# Patient Record
Sex: Female | Born: 1995 | Hispanic: No | Marital: Single | Smoking: Never smoker
Health system: Southern US, Community
[De-identification: ages and names within clinical notes are randomized; demographics above are authoritative.]

## PROBLEM LIST (undated history)

## (undated) ENCOUNTER — Inpatient Hospital Stay (HOSPITAL_COMMUNITY): Payer: Self-pay

## (undated) DIAGNOSIS — Z789 Other specified health status: Secondary | ICD-10-CM

## (undated) HISTORY — DX: Other specified health status: Z78.9

## (undated) HISTORY — PX: TONSILLECTOMY AND ADENOIDECTOMY: SHX28

## (undated) HISTORY — PX: WISDOM TOOTH EXTRACTION: SHX21

## (undated) HISTORY — PX: OTHER SURGICAL HISTORY: SHX169

---

## 2018-04-26 LAB — OB RESULTS CONSOLE RPR: RPR: NONREACTIVE

## 2018-04-26 LAB — OB RESULTS CONSOLE ABO/RH: RH Type: POSITIVE

## 2018-04-26 LAB — OB RESULTS CONSOLE GC/CHLAMYDIA
Chlamydia: NEGATIVE
Gonorrhea: NEGATIVE

## 2018-04-26 LAB — OB RESULTS CONSOLE HIV ANTIBODY (ROUTINE TESTING): HIV: NONREACTIVE

## 2018-04-26 LAB — OB RESULTS CONSOLE RUBELLA ANTIBODY, IGM: Rubella: IMMUNE

## 2018-04-26 LAB — OB RESULTS CONSOLE HEPATITIS B SURFACE ANTIGEN: Hepatitis B Surface Ag: NEGATIVE

## 2018-06-21 ENCOUNTER — Encounter: Payer: Self-pay | Admitting: General Practice

## 2018-07-02 ENCOUNTER — Encounter: Payer: Self-pay | Admitting: General Practice

## 2018-07-02 ENCOUNTER — Ambulatory Visit (INDEPENDENT_AMBULATORY_CARE_PROVIDER_SITE_OTHER): Payer: Medicaid Other | Admitting: Obstetrics and Gynecology

## 2018-07-02 ENCOUNTER — Encounter: Payer: Self-pay | Admitting: Obstetrics and Gynecology

## 2018-07-02 ENCOUNTER — Other Ambulatory Visit: Payer: Self-pay

## 2018-07-02 VITALS — BP 129/82 | HR 75 | Temp 98.9°F | Ht 62.0 in | Wt 178.0 lb

## 2018-07-02 DIAGNOSIS — S3992XD Unspecified injury of lower back, subsequent encounter: Secondary | ICD-10-CM

## 2018-07-02 DIAGNOSIS — Z3402 Encounter for supervision of normal first pregnancy, second trimester: Secondary | ICD-10-CM | POA: Diagnosis not present

## 2018-07-02 MED ORDER — CYCLOBENZAPRINE HCL 10 MG PO TABS: 10.0000 mg | ORAL_TABLET | Freq: Three times a day (TID) | ORAL | 1 refills | Status: AC | PRN
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
## 2018-07-02 NOTE — Patient Instructions (Signed)
Second Trimester of Pregnancy The second trimester is from week 13 through week 28, month 4 through 6. This is often the time in pregnancy that you feel your best. Often times, morning sickness has lessened or quit. You may have more energy, and you may get hungry more often. Your unborn baby (fetus) is growing rapidly. At the end of the sixth month, he or she is about 9 inches long and weighs about 1 pounds. You will likely feel the baby move (quickening) between 18 and 20 weeks of pregnancy. Follow these instructions at home:  Avoid all smoking, herbs, and alcohol. Avoid drugs not approved by your doctor.  Do not use any tobacco products, including cigarettes, chewing tobacco, and electronic cigarettes. If you need help quitting, ask your doctor. You may get counseling or other support to help you quit.  Only take medicine as told by your doctor. Some medicines are safe and some are not during pregnancy.  Exercise only as told by your doctor. Stop exercising if you start having cramps.  Eat regular, healthy meals.  Wear a good support bra if your breasts are tender.  Do not use hot tubs, steam rooms, or saunas.  Wear your seat belt when driving.  Avoid raw meat, uncooked cheese, and liter boxes and soil used by cats.  Take your prenatal vitamins.  Take 1500-2000 milligrams of calcium daily starting at the 20th week of pregnancy until you deliver your baby.  Try taking medicine that helps you poop (stool softener) as needed, and if your doctor approves. Eat more fiber by eating fresh fruit, vegetables, and whole grains. Drink enough fluids to keep your pee (urine) clear or pale yellow.  Take warm water baths (sitz baths) to soothe pain or discomfort caused by hemorrhoids. Use hemorrhoid cream if your doctor approves.  If you have puffy, bulging veins (varicose veins), wear support hose. Raise (elevate) your feet for 15 minutes, 3-4 times a day. Limit salt in your diet.  Avoid heavy  lifting, wear low heals, and sit up straight.  Rest with your legs raised if you have leg cramps or low back pain.  Visit your dentist if you have not gone during your pregnancy. Use a soft toothbrush to brush your teeth. Be gentle when you floss.  You can have sex (intercourse) unless your doctor tells you not to.  Go to your doctor visits. Get help if:  You feel dizzy.  You have mild cramps or pressure in your lower belly (abdomen).  You have a nagging pain in your belly area.  You continue to feel sick to your stomach (nauseous), throw up (vomit), or have watery poop (diarrhea).  You have bad smelling fluid coming from your vagina.  You have pain with peeing (urination). Get help right away if:  You have a fever.  You are leaking fluid from your vagina.  You have spotting or bleeding from your vagina.  You have severe belly cramping or pain.  You lose or gain weight rapidly.  You have trouble catching your breath and have chest pain.  You notice sudden or extreme puffiness (swelling) of your face, hands, ankles, feet, or legs.  You have not felt the baby move in over an hour.  You have severe headaches that do not go away with medicine.  You have vision changes. This information is not intended to replace advice given to you by your health care provider. Make sure you discuss any questions you have with your health care   provider. Document Released: 12/03/2009 Document Revised: 02/14/2016 Document Reviewed: 11/09/2012 Elsevier Interactive Patient Education  2017 [employer]vier Inc.  

## 2018-07-04 LAB — AFP TETRA
DIA Mom Value: 1.07
DIA VALUE (EIA): 168.54 pg/mL
DSR (By Age)    1 IN: 1111
DSR (SECOND TRIMESTER) 1 IN: 10000
Gestational Age: 19 WEEKS
MSAFP Mom: 1.24
MSAFP: 54.8 ng/mL
MSHCG Mom: 0.52
MSHCG: 11897 m[IU]/mL
Maternal Age At EDD: 22.7 yr
Osb Risk: 5748
Test Results:: NEGATIVE
UE3 MOM: 1.47
UE3 VALUE: 2.33 ng/mL
Weight: 178 [lb_av]

## 2018-07-05 ENCOUNTER — Telehealth: Payer: Self-pay | Admitting: *Deleted

## 2018-07-05 ENCOUNTER — Telehealth: Payer: Self-pay | Admitting: General Practice

## 2018-07-05 LAB — SICKLE CELL SCREEN: SICKLE CELL SCREEN: NEGATIVE

## 2018-07-05 NOTE — Telephone Encounter (Signed)
-----   Message from Raelyn Mora, [place] sent at 07/04/2018  2:56 PM EDT ----- Please call patient and inform her that her AFP tetra is negative

## 2018-07-05 NOTE — Telephone Encounter (Signed)
Left message for patient to give our office a call in regards to appointment on 07/16/18 at 10:15am with Dr. Adrian Blackwater in [place].

## 2018-07-05 NOTE — Telephone Encounter (Signed)
Patient informed that AFP results are negative. Pt stated understanding.  Clovis Pu, RN

## 2018-07-07 ENCOUNTER — Encounter (HOSPITAL_COMMUNITY): Payer: Self-pay

## 2018-07-12 LAB — SMN1 COPY NUMBER ANALYSIS (SMA CARRIER SCREENING)

## 2018-07-14 ENCOUNTER — Ambulatory Visit (HOSPITAL_COMMUNITY)
Admission: RE | Admit: 2018-07-14 | Discharge: 2018-07-14 | Disposition: A | Discharge status: Home / Self Care | Payer: Medicaid Other | Source: Ambulatory Visit | Attending: Obstetrics and Gynecology | Admitting: Obstetrics and Gynecology

## 2018-07-14 ENCOUNTER — Other Ambulatory Visit: Payer: Self-pay | Admitting: Obstetrics and Gynecology

## 2018-07-14 DIAGNOSIS — O99212 Obesity complicating pregnancy, second trimester: Secondary | ICD-10-CM | POA: Diagnosis not present

## 2018-07-14 DIAGNOSIS — Z3A21 21 weeks gestation of pregnancy: Secondary | ICD-10-CM | POA: Diagnosis not present

## 2018-07-14 DIAGNOSIS — Z3402 Encounter for supervision of normal first pregnancy, second trimester: Secondary | ICD-10-CM

## 2018-07-14 DIAGNOSIS — Z3686 Encounter for antenatal screening for cervical length: Secondary | ICD-10-CM | POA: Diagnosis not present

## 2018-07-14 DIAGNOSIS — Z3689 Encounter for other specified antenatal screening: Secondary | ICD-10-CM | POA: Insufficient documentation

## 2018-07-14 DIAGNOSIS — E669 Obesity, unspecified: Secondary | ICD-10-CM | POA: Diagnosis not present

## 2018-07-14 DIAGNOSIS — Z363 Encounter for antenatal screening for malformations: Secondary | ICD-10-CM | POA: Diagnosis not present

## 2018-07-15 ENCOUNTER — Encounter (HOSPITAL_COMMUNITY): Payer: Self-pay | Admitting: Emergency Medicine

## 2018-07-15 ENCOUNTER — Other Ambulatory Visit: Payer: Self-pay | Admitting: Obstetrics and Gynecology

## 2018-07-15 ENCOUNTER — Telehealth: Payer: Self-pay

## 2018-07-15 ENCOUNTER — Other Ambulatory Visit: Payer: Self-pay

## 2018-07-15 ENCOUNTER — Other Ambulatory Visit (HOSPITAL_COMMUNITY): Payer: Self-pay | Admitting: *Deleted

## 2018-07-15 DIAGNOSIS — Z331 Pregnant state, incidental: Secondary | ICD-10-CM | POA: Insufficient documentation

## 2018-07-15 DIAGNOSIS — Z3402 Encounter for supervision of normal first pregnancy, second trimester: Secondary | ICD-10-CM

## 2018-07-15 DIAGNOSIS — R21 Rash and other nonspecific skin eruption: Secondary | ICD-10-CM | POA: Diagnosis present

## 2018-07-15 DIAGNOSIS — H01119 Allergic dermatitis of unspecified eye, unspecified eyelid: Secondary | ICD-10-CM | POA: Insufficient documentation

## 2018-07-15 DIAGNOSIS — Z362 Encounter for other antenatal screening follow-up: Secondary | ICD-10-CM

## 2018-07-15 NOTE — ED Notes (Addendum)
Pt from home with c/o swelling and pain to bilateral eyes. Pt states that this began over 1 month ago. Pt states she had a panic attack yesterday at work and that's when symptoms became increasingly worse. No airway obstruction, SOB, or signs hives or skin reaction in any other parts of her body. Pt is [redacted] weeks pregnant. Pt took benadryl earlier today with no change in symptoms. Pt was seen at [facility] today and was told this was eczema. Pt states she "doesn't believe that" and wants a second opinion.

## 2018-07-15 NOTE — Telephone Encounter (Signed)
Patient called in reporting she used Eucerin, Cetaphil and Coconut Oil on the skin around her eyes yesterday, Wednesday, 07/14/18 due to dryness. Patient reports that she began noticing the area around her eyes becoming swollen and very red. She reports she put a cool washcloth over her eyes in hopes of having relief from the swelling and redness. She also reports she did have an anxiety attack yesterday - feels like this may have increased the swelling and redness. Patient reports that she feels the swelling has gotten worse this morning and is concerned.  Reviewed with provider - advised patient she can go to the nearest Urgent Care NOT [facility] for evaluation and treatment of her eyes. Patient stated she understood and had no further questions.

## 2018-07-16 ENCOUNTER — Encounter: Payer: Self-pay | Admitting: Family Medicine

## 2018-07-16 ENCOUNTER — Ambulatory Visit: Payer: Medicaid Other | Admitting: Family Medicine

## 2018-07-16 ENCOUNTER — Emergency Department (HOSPITAL_COMMUNITY)
Admission: EM | Admit: 2018-07-16 | Discharge: 2018-07-16 | Disposition: A | Discharge status: Home / Self Care | Payer: Medicaid Other | Attending: Emergency Medicine | Admitting: Emergency Medicine

## 2018-07-16 ENCOUNTER — Ambulatory Visit (INDEPENDENT_AMBULATORY_CARE_PROVIDER_SITE_OTHER): Payer: Medicaid Other | Admitting: Family Medicine

## 2018-07-16 VITALS — BP 133/86 | HR 104 | Wt 182.0 lb

## 2018-07-16 DIAGNOSIS — Z3402 Encounter for supervision of normal first pregnancy, second trimester: Secondary | ICD-10-CM

## 2018-07-16 DIAGNOSIS — H01119 Allergic dermatitis of unspecified eye, unspecified eyelid: Secondary | ICD-10-CM

## 2018-07-16 DIAGNOSIS — S3992XD Unspecified injury of lower back, subsequent encounter: Secondary | ICD-10-CM

## 2018-07-16 DIAGNOSIS — M545 Low back pain, unspecified: Secondary | ICD-10-CM

## 2018-07-16 DIAGNOSIS — M9903 Segmental and somatic dysfunction of lumbar region: Secondary | ICD-10-CM

## 2018-07-16 MED ORDER — HYDROCORTISONE 1 % EX CREA: TOPICAL_CREAM | CUTANEOUS | 0 refills | Status: DC

## 2018-07-16 NOTE — ED Provider Notes (Signed)
[facility]-EMERGENCY DEPT Provider Note   CSN: [id number] Arrival date & time: 07/15/18  2203     History   Chief Complaint Chief Complaint  Patient presents with  . Rash    HPI Jessica Ayala is a 22 y.o. female.  Patient presents to the emergency department with a chief complaint of eyelid rash.  She reports having the symptoms for the past several days.  She has tried using Benadryl with little relief.  She is about [redacted] weeks pregnant.  She has tried several different lotions and creams as well as cucumbers, but has not found any relief of her symptoms.  She denies any fevers chills.  Denies any eye pain or pain with eye movement.  Denies vision changes.  The history is provided by the patient. No language interpreter was used.    Past Medical History:  Diagnosis Date  . Medical history non-contributory     Patient Active Problem List   Diagnosis Date Noted  . Encounter for supervision of normal first pregnancy in second trimester 07/02/2018    Past Surgical History:  Procedure Laterality Date  . TONSILLECTOMY AND ADENOIDECTOMY    . WISDOM TOOTH EXTRACTION       OB History    Gravida  1   Para      Term      Preterm      AB      Living        SAB      TAB      Ectopic      Multiple      Live Births               Home Medications    Prior to Admission medications   Medication Sig Start Date End Date Taking? Authorizing Provider  cyclobenzaprine (FLEXERIL) 10 MG tablet Take 1 tablet (10 mg total) by mouth every 8 (eight) hours as needed for muscle spasms. 07/02/18   Raelyn Mora, CNM  hydrocortisone cream 1 % Apply to affected area 2 times daily 07/16/18   Roxy Horseman, PA-C  ondansetron (ZOFRAN-ODT) 8 MG disintegrating tablet Take 8 mg by mouth every 8 (eight) hours as needed for nausea or vomiting (PRN).    [provider]    Family History No family history on file.  Social History Social History     Tobacco Use  . Smoking status: Never Smoker  . Smokeless tobacco: Never Used  Substance Use Topics  . Alcohol use: Never    Frequency: Never  . Drug use: Never     Allergies   Patient has no known allergies.   Review of Systems Review of Systems  All other systems reviewed and are negative.    Physical Exam Updated Vital Signs BP 123/84 (BP Location: Left Arm)   Pulse 87   Temp 97.9 F (36.6 C) (Oral)   Resp 16   LMP 02/14/2018 (Approximate)   SpO2 100%   Physical Exam  Constitutional: She is oriented to person, place, and time. She appears well-developed and well-nourished.  HENT:  Head: Normocephalic and atraumatic.  Mild eyelid dermatitis, no evidence of cellulitis  Eyes: Conjunctivae and EOM are normal.  No conjunctival erythema or discharge  Neck: Normal range of motion.  Cardiovascular: Normal rate.  Pulmonary/Chest: Effort normal.  Abdominal: She exhibits no distension.  Musculoskeletal: Normal range of motion.  Neurological: She is alert and oriented to person, place, and time.  Skin: Skin is dry.  Psychiatric:  She has a normal mood and affect. Her behavior is normal. Judgment and thought content normal.  Nursing note and vitals reviewed.    ED Treatments / Results  Labs (all labs ordered are listed, but only abnormal results are displayed) Labs Reviewed - No data to display  EKG None  Procedures Procedures (including critical care time)  Medications Ordered in ED Medications - No data to display   Initial Impression / Assessment and Plan / ED Course  I have reviewed the triage vital signs and the nursing notes.  Pertinent labs & imaging results that were available during my care of the patient were reviewed by me and considered in my medical decision making (see chart for details).     Patient with eyelid dermatitis.  Will treat with 1% hydrocortisone.  Least potent dose given pregnancy.  Final Clinical Impressions(s) / ED  Diagnoses   Final diagnoses:  Eyelid dermatitis, allergic/contact    ED Discharge Orders         Ordered    hydrocortisone cream 1 %     07/16/18 0304           Roxy Horseman, PA-C 07/16/18 1610    Gilda Crease, MD 07/16/18 [phone]
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
## 2018-07-29 ENCOUNTER — Encounter: Payer: Medicaid Other | Admitting: Obstetrics and Gynecology

## 2018-07-29 ENCOUNTER — Ambulatory Visit (INDEPENDENT_AMBULATORY_CARE_PROVIDER_SITE_OTHER): Payer: Medicaid Other | Admitting: Family Medicine

## 2018-07-29 ENCOUNTER — Other Ambulatory Visit (HOSPITAL_COMMUNITY)
Admission: RE | Admit: 2018-07-29 | Discharge: 2018-07-29 | Disposition: A | Discharge status: Home / Self Care | Payer: Medicaid Other | Source: Ambulatory Visit | Attending: Family Medicine | Admitting: Family Medicine

## 2018-07-29 VITALS — BP 123/57 | HR 84 | Wt 185.0 lb

## 2018-07-29 DIAGNOSIS — O26899 Other specified pregnancy related conditions, unspecified trimester: Secondary | ICD-10-CM | POA: Insufficient documentation

## 2018-07-29 DIAGNOSIS — M545 Low back pain, unspecified: Secondary | ICD-10-CM

## 2018-07-29 DIAGNOSIS — N898 Other specified noninflammatory disorders of vagina: Secondary | ICD-10-CM | POA: Insufficient documentation

## 2018-07-29 DIAGNOSIS — O26892 Other specified pregnancy related conditions, second trimester: Secondary | ICD-10-CM

## 2018-07-29 DIAGNOSIS — Z3402 Encounter for supervision of normal first pregnancy, second trimester: Secondary | ICD-10-CM

## 2018-07-29 DIAGNOSIS — S3992XD Unspecified injury of lower back, subsequent encounter: Secondary | ICD-10-CM

## 2018-07-29 DIAGNOSIS — M9903 Segmental and somatic dysfunction of lumbar region: Secondary | ICD-10-CM

## 2018-07-29 NOTE — Progress Notes (Signed)
   PRENATAL VISIT NOTE  Subjective:  Jessica Ayala is a 22 y.o. G1P0 at [redacted]w[redacted]d being seen today for ongoing prenatal care.  She is currently monitored for the following issues for this low-risk pregnancy and has Encounter for supervision of normal first pregnancy in second trimester on their problem list.  Patient reports had moderate amount of watery vaginal discharge that wet her pants. No continued leaking of fluid after that. Described fluid as white/cloudy. Back pain same - improved for a day, then returned.  Contractions: Not present. Vag. Bleeding: None.  Movement: Present. Denies leaking of fluid.   The following portions of the patient's history were reviewed and updated as appropriate: allergies, current medications, past family history, past medical history, past social history, past surgical history and problem list. Problem list updated.  Objective:   Vitals:   07/29/18 1337  BP: (!) 123/57  Pulse: 84  Weight: 185 lb (83.9 kg)    Fetal Status:     Movement: Present     General:  Alert, oriented and cooperative. Patient is in no acute distress.  Skin: Skin is warm and dry. No rash noted.   Cardiovascular: Normal heart rate noted  Respiratory: Normal respiratory effort, no problems with respiration noted  Abdomen: Soft, gravid, appropriate for gestational age. Pain/Pressure: Absent     Pelvic:  Cervical exam deferred        MSK: Restriction, tenderness, tissue texture changes, and paraspinal spasm in the lumbar spine  Neuro: Moves all four extremities with no focal neurological deficit  Extremities: Normal range of motion.  Edema: None  Mental Status: Normal mood and affect. Normal behavior. Normal judgment and thought content.   OSE: Head   Cervical   Thoracic T10 FSRL  Rib Rib 10 inhaled  Lumbar L5 ESRL, L1 ESRR  Sacrum L/L  Pelvis Right ant innom    Assessment and Plan:  Pregnancy: G1P0 at [redacted]w[redacted]d  1. Encounter for supervision of normal first pregnancy in second  trimester FHT and FH normal. No pooling or ferning.  2. Injury of back, subsequent encounter 3. Lumbar back pain 4. Somatic dysfunction of spine, lumbar OMT done after patient permission. HVLA technique utilized. 5 areas treated with improvement of tissue texture and joint mobility. Patient tolerated procedure well.    Preterm labor symptoms and general obstetric precautions including but not limited to vaginal bleeding, contractions, leaking of fluid and fetal movement were reviewed in detail with the patient. Please refer to After Visit Summary for other counseling recommendations.  No follow-ups on file.  Levie Heritage, DO

## 2018-07-29 NOTE — Progress Notes (Signed)
Patient complaining of having some cramping. Patient states she has a cloudy white discharge. Armandina Stammer RN

## 2018-07-30 ENCOUNTER — Encounter: Payer: Medicaid Other | Admitting: Obstetrics and Gynecology

## 2018-07-30 ENCOUNTER — Encounter: Payer: Medicaid Other | Admitting: Family Medicine

## 2018-07-30 LAB — CERVICOVAGINAL ANCILLARY ONLY
BACTERIAL VAGINITIS: POSITIVE — AB
Candida vaginitis: NEGATIVE

## 2018-08-02 ENCOUNTER — Other Ambulatory Visit: Payer: Self-pay | Admitting: Family Medicine

## 2018-08-02 MED ORDER — METRONIDAZOLE 500 MG PO TABS: 500.0000 mg | ORAL_TABLET | Freq: Two times a day (BID) | ORAL | 0 refills | Status: DC

## 2018-08-06 ENCOUNTER — Ambulatory Visit (INDEPENDENT_AMBULATORY_CARE_PROVIDER_SITE_OTHER): Payer: Medicaid Other | Admitting: Family Medicine

## 2018-08-06 VITALS — BP 120/74 | HR 102 | Wt 186.0 lb

## 2018-08-06 DIAGNOSIS — Z3402 Encounter for supervision of normal first pregnancy, second trimester: Secondary | ICD-10-CM

## 2018-08-06 DIAGNOSIS — M545 Low back pain, unspecified: Secondary | ICD-10-CM

## 2018-08-06 DIAGNOSIS — M9903 Segmental and somatic dysfunction of lumbar region: Secondary | ICD-10-CM

## 2018-08-06 DIAGNOSIS — S3992XD Unspecified injury of lower back, subsequent encounter: Secondary | ICD-10-CM

## 2018-08-06 DIAGNOSIS — S3992XA Unspecified injury of lower back, initial encounter: Secondary | ICD-10-CM

## 2018-08-06 NOTE — Progress Notes (Signed)
   PRENATAL VISIT NOTE  Subjective:  Jessica Ayala is a 22 y.o. G1P0 at 788w5d being seen today for ongoing prenatal care.  She is currently monitored for the following issues for this low-risk pregnancy and has Encounter for supervision of normal first pregnancy in second trimester on their problem list.  Patient reports backache. Improved after OMT. Started returning yesterday.  Contractions: Not present. Vag. Bleeding: None.  Movement: Present. Denies leaking of fluid.   The following portions of the patient's history were reviewed and updated as appropriate: allergies, current medications, past family history, past medical history, past social history, past surgical history and problem list. Problem list updated.  Objective:   Vitals:   08/06/18 0900  BP: 120/74  Pulse: (!) 102  Weight: 186 lb (84.4 kg)    Fetal Status: Fetal Heart Rate (bpm): 147   Movement: Present     General:  Alert, oriented and cooperative. Patient is in no acute distress.  Skin: Skin is warm and dry. No rash noted.   Cardiovascular: Normal heart rate noted  Respiratory: Normal respiratory effort, no problems with respiration noted  Abdomen: Soft, gravid, appropriate for gestational age.  Pain/Pressure: Absent     Pelvic: Cervical exam deferred        Extremities: Normal range of motion.  Edema: None  Mental Status: Normal mood and affect. Normal behavior. Normal judgment and thought content.   MSK: Restriction, tenderness, tissue texture changes, and paraspinal spasm in the lumbar spine  Neuro: Moves all four extremities with no focal neurological deficit   OSE: Head   Cervical   Thoracic   Rib   Lumbar L1 ESRR, L5 ESRL  Sacrum L/L  Pelvis Right ant innom     Assessment and Plan:  Pregnancy: G1P0 at 1288w5d  1. Encounter for supervision of normal first pregnancy in second trimester FHT and FH normal. 2hr GTT next visit. Would like water birth - discussed needing to take WB class.  2. Injury of  back, subsequent encounter 3. Lumbar back pain 4. Somatic dysfunction of spine, lumbar OMT done after patient permission. HVLA technique utilized. 3 areas treated with improvement of tissue texture and joint mobility. Patient tolerated procedure well.    Preterm labor symptoms and general obstetric precautions including but not limited to vaginal bleeding, contractions, leaking of fluid and fetal movement were reviewed in detail with the patient. Please refer to After Visit Summary for other counseling recommendations.  Return in about 4 weeks (around 09/03/2018).  Future Appointments  Date Time Provider Department Center  08/18/2018  8:15 AM [facility] US 4 [facility]    Levie HeritageJacob J Channie Bostick, DO
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
## 2018-08-18 ENCOUNTER — Ambulatory Visit (HOSPITAL_COMMUNITY)
Admission: RE | Admit: 2018-08-18 | Discharge: 2018-08-18 | Disposition: A | Discharge status: Home / Self Care | Payer: Medicaid Other | Source: Ambulatory Visit | Attending: Obstetrics and Gynecology | Admitting: Obstetrics and Gynecology

## 2018-08-18 DIAGNOSIS — Z362 Encounter for other antenatal screening follow-up: Secondary | ICD-10-CM | POA: Insufficient documentation

## 2018-08-18 DIAGNOSIS — Z3A26 26 weeks gestation of pregnancy: Secondary | ICD-10-CM | POA: Diagnosis not present

## 2018-08-18 DIAGNOSIS — O99212 Obesity complicating pregnancy, second trimester: Secondary | ICD-10-CM

## 2018-09-02 ENCOUNTER — Encounter: Payer: Self-pay | Admitting: Family Medicine

## 2018-09-02 ENCOUNTER — Ambulatory Visit (INDEPENDENT_AMBULATORY_CARE_PROVIDER_SITE_OTHER): Payer: Medicaid Other | Admitting: Family Medicine

## 2018-09-02 VITALS — BP 128/62 | HR 105 | Wt 187.0 lb

## 2018-09-02 DIAGNOSIS — M545 Low back pain, unspecified: Secondary | ICD-10-CM

## 2018-09-02 DIAGNOSIS — M9903 Segmental and somatic dysfunction of lumbar region: Secondary | ICD-10-CM

## 2018-09-02 DIAGNOSIS — Z3402 Encounter for supervision of normal first pregnancy, second trimester: Secondary | ICD-10-CM

## 2018-09-02 NOTE — Progress Notes (Signed)
Patient ate breakfast this morning. Patient states she would like to refuse the glucose tolerance test. She does not want to drink the glucose drink- advise her to discuss with provider. Armandina StammerJennifer Madaline Lefeber RN

## 2018-09-02 NOTE — Progress Notes (Signed)
   PRENATAL VISIT NOTE  Subjective:  Jessica Ayala is a 22 y.o. G1P0 at 7581w4d being seen today for ongoing prenatal care.  She is currently monitored for the following issues for this low-risk pregnancy and has Encounter for supervision of normal first pregnancy in second trimester on their problem list.  Patient reports backache. OMT helpful. Started increasing last week.  Contractions: Not present. Vag. Bleeding: None.  Movement: Present. Denies leaking of fluid.   The following portions of the patient's history were reviewed and updated as appropriate: allergies, current medications, past family history, past medical history, past social history, past surgical history and problem list. Problem list updated.  Objective:   Vitals:   09/02/18 1008  BP: 128/62  Pulse: (!) 105  Weight: 187 lb (84.8 kg)    Fetal Status: Fetal Heart Rate (bpm): 140 Fundal Height: 29 cm Movement: Present     General:  Alert, oriented and cooperative. Patient is in no acute distress.  Skin: Skin is warm and dry. No rash noted.   Cardiovascular: Normal heart rate noted  Respiratory: Normal respiratory effort, no problems with respiration noted  Abdomen: Soft, gravid, appropriate for gestational age. Pain/Pressure: Absent     Pelvic:  Cervical exam deferred        MSK: Restriction, tenderness, tissue texture changes, and paraspinal spasm in the lumbar spine  Neuro: Moves all four extremities with no focal neurological deficit  Extremities: Normal range of motion.  Edema: None  Mental Status: Normal mood and affect. Normal behavior. Normal judgment and thought content.   OSE: Head   Cervical   Thoracic   Rib   Lumbar L5 ESRL, L1 ESRR  Sacrum L/L  Pelvis Right ant innom    Assessment and Plan:  Pregnancy: G1P0 at 7081w4d  1. Encounter for supervision of normal first pregnancy in second trimester FHT and FH normal. Declines 2hr GTT - concerned about ingredients, but willing to do jelly beans. Gave  information. Patient to return next week for testing. Declines TdaP. Discussed potential pertussis infection in newborns. Patient precontemplative  2. Lumbar back pain 3. Somatic dysfunction of spine, lumbar OMT done after patient permission. HVLA technique utilized. 3 areas treated with improvement of tissue texture and joint mobility. Patient tolerated procedure well.    Preterm labor symptoms and general obstetric precautions including but not limited to vaginal bleeding, contractions, leaking of fluid and fetal movement were reviewed in detail with the patient. Please refer to After Visit Summary for other counseling recommendations.  Return in about 1 week (around 09/09/2018).  Jessica Ayala, Jessica J, DO

## 2018-09-03 ENCOUNTER — Encounter: Payer: Medicaid Other | Admitting: Obstetrics & Gynecology

## 2018-09-08 ENCOUNTER — Other Ambulatory Visit: Payer: Medicaid Other

## 2018-09-13 ENCOUNTER — Other Ambulatory Visit (INDEPENDENT_AMBULATORY_CARE_PROVIDER_SITE_OTHER): Payer: Medicaid Other

## 2018-09-13 DIAGNOSIS — Z3402 Encounter for supervision of normal first pregnancy, second trimester: Secondary | ICD-10-CM | POA: Diagnosis not present

## 2018-09-13 NOTE — Progress Notes (Signed)
Chart reviewed - agree with RN documentation.   

## 2018-09-13 NOTE — Progress Notes (Unsigned)
Patient presents for 28 week labs. Patient is doing one hour jelly bean gtt. Armandina StammerJennifer Howard RN

## 2018-09-14 LAB — CBC
HEMATOCRIT: 35.7 % (ref 34.0–46.6)
HEMOGLOBIN: 11.4 g/dL (ref 11.1–15.9)
MCH: 22.9 pg — AB (ref 26.6–33.0)
MCHC: 31.9 g/dL (ref 31.5–35.7)
MCV: 72 fL — ABNORMAL LOW (ref 79–97)
Platelets: 381 10*3/uL (ref 150–450)
RBC: 4.98 x10E6/uL (ref 3.77–5.28)
RDW: 14.5 % (ref 12.3–15.4)
WBC: 8.1 10*3/uL (ref 3.4–10.8)

## 2018-09-14 LAB — GLUCOSE TOLERANCE, 1 HOUR: Glucose, 1Hr PP: 159 mg/dL (ref 65–199)

## 2018-09-14 LAB — HIV ANTIBODY (ROUTINE TESTING W REFLEX): HIV Screen 4th Generation wRfx: NONREACTIVE

## 2018-09-14 LAB — RPR: RPR: NONREACTIVE

## 2018-09-16 ENCOUNTER — Telehealth: Payer: Self-pay

## 2018-09-16 DIAGNOSIS — O24419 Gestational diabetes mellitus in pregnancy, unspecified control: Secondary | ICD-10-CM

## 2018-09-16 MED ORDER — ACCU-CHEK NANO SMARTVIEW W/DEVICE KIT: 1.0000 | PACK | 0 refills | Status: DC

## 2018-09-16 MED ORDER — ACCU-CHEK FASTCLIX LANCETS MISC: 1.0000 [IU] | Freq: Four times a day (QID) | 12 refills | Status: DC

## 2018-09-16 MED ORDER — GLUCOSE BLOOD VI STRP: ORAL_STRIP | 12 refills | Status: DC

## 2018-09-16 NOTE — Telephone Encounter (Signed)
Called patient to inform her she did not pass one hour gtt (jelly beans). She will need to do 3 hr gtt or go to diabetes education per Dr. Adrian BlackwaterStinson.  Called and rang with no answer. Armandina StammerJennifer Karalina Tift RN

## 2018-09-17 ENCOUNTER — Ambulatory Visit (INDEPENDENT_AMBULATORY_CARE_PROVIDER_SITE_OTHER): Payer: Medicaid Other | Admitting: Family Medicine

## 2018-09-17 ENCOUNTER — Other Ambulatory Visit: Payer: Self-pay

## 2018-09-17 VITALS — BP 105/70 | HR 103 | Wt 188.0 lb

## 2018-09-17 DIAGNOSIS — Z3402 Encounter for supervision of normal first pregnancy, second trimester: Secondary | ICD-10-CM

## 2018-09-17 DIAGNOSIS — M545 Low back pain, unspecified: Secondary | ICD-10-CM

## 2018-09-17 DIAGNOSIS — O24419 Gestational diabetes mellitus in pregnancy, unspecified control: Secondary | ICD-10-CM

## 2018-09-17 DIAGNOSIS — M9903 Segmental and somatic dysfunction of lumbar region: Secondary | ICD-10-CM

## 2018-09-17 MED ORDER — GLUCOSE BLOOD VI STRP: ORAL_STRIP | 12 refills | Status: DC

## 2018-09-17 MED ORDER — ACCU-CHEK NANO SMARTVIEW W/DEVICE KIT: 1.0000 | PACK | 0 refills | Status: DC

## 2018-09-17 MED ORDER — ACCU-CHEK FASTCLIX LANCETS MISC: 1.0000 [IU] | Freq: Four times a day (QID) | 12 refills | Status: DC

## 2018-09-17 NOTE — Progress Notes (Signed)
   PRENATAL VISIT NOTE  Subjective:  Nat ChristenKayla Timme is a 22 y.o. G1P0 at 4842w5d being seen today for ongoing prenatal care.  She is currently monitored for the following issues for this high-risk pregnancy and has Encounter for supervision of normal first pregnancy in second trimester on their problem list.  Patient reports backache, worse on left.  Contractions: Not present. Vag. Bleeding: None.  Movement: Present. Denies leaking of fluid.   The following portions of the patient's history were reviewed and updated as appropriate: allergies, current medications, past family history, past medical history, past social history, past surgical history and problem list. Problem list updated.  Objective:   Vitals:   09/17/18 0908  BP: 105/70  Pulse: (!) 103  Weight: 188 lb 0.6 oz (85.3 kg)    Fetal Status: Fetal Heart Rate (bpm): 154   Movement: Present     General:  Alert, oriented and cooperative. Patient is in no acute distress.  Skin: Skin is warm and dry. No rash noted.   Cardiovascular: Normal heart rate noted  Respiratory: Normal respiratory effort, no problems with respiration noted  Abdomen: Soft, gravid, appropriate for gestational age. Pain/Pressure: Present     Pelvic:  Cervical exam deferred        MSK: Restriction, tenderness, tissue texture changes, and paraspinal spasm in the lumbar spine  Neuro: Moves all four extremities with no focal neurological deficit  Extremities: Normal range of motion.  Edema: None  Mental Status: Normal mood and affect. Normal behavior. Normal judgment and thought content.   OSE: Head   Cervical   Thoracic   Rib   Lumbar L1 ESRR, L5 ESRL  Sacrum L/L  Pelvis Right ant    Assessment and Plan:  Pregnancy: G1P0 at 1942w5d  1. Encounter for supervision of normal first pregnancy in second trimester FHT and FH normal  2. Gestational diabetes mellitus (GDM) in third trimester, gestational diabetes method of control unspecified Hasn't picked up  meter yet - will pick it up today.  3. Lumbar back pain 4. Somatic dysfunction of spine, lumbar OMT done after patient permission. HVLA technique utilized. 3 areas treated with improvement of tissue texture and joint mobility. Patient tolerated procedure well.    Preterm labor symptoms and general obstetric precautions including but not limited to vaginal bleeding, contractions, leaking of fluid and fetal movement were reviewed in detail with the patient. Please refer to After Visit Summary for other counseling recommendations.  No follow-ups on file.  Levie HeritageStinson, Jacob J, DO

## 2018-09-20 ENCOUNTER — Telehealth: Payer: Self-pay

## 2018-09-20 MED ORDER — ACCU-CHEK FASTCLIX LANCETS MISC: 1.0000 | Freq: Four times a day (QID) | 12 refills | Status: DC

## 2018-09-20 MED ORDER — GLUCOSE BLOOD VI STRP: ORAL_STRIP | 12 refills | Status: DC

## 2018-09-20 MED ORDER — ACCU-CHEK GUIDE ME W/DEVICE KIT: 1.0000 | PACK | Freq: Four times a day (QID) | 0 refills | Status: DC

## 2018-09-20 NOTE — Telephone Encounter (Signed)
[facility] fax states that patient needs accuchek guide kit and strips and lancets.  (nano smartview no longer available - has been discontinued)   New scripts sent to [facility]. Kathrene Alu RN

## 2018-09-22 NOTE — L&D Delivery Note (Signed)
Delivery Note:   Complete dilation at   0338 11/27/2018 Onset of pushing at 0340 11/27/2018 FHR second stage 1 and 2  Analgesia /Anesthesia intrapartum:  epidural Used nitrous and hydrotherapy in early labor  Delivery of a Live born female  Birth Weight:  pending APGAR: 7, 9  Newborn Delivery   Time head delivered:  11/27/2018 04:34:00 Birth date/time:  11/27/2018 04:34:00 Delivery type:  Vaginal, Spontaneous     Nuchal Cord: No  Cord double clamped after cessation of pulsation, cut by FOB Jerrod.  Collection of cord blood donation-None  Arterial cord blood sample-No   Cord blood collected for typing.   Placenta delivered-Spontaneous  with 3 vessels , meconium stained Placenta to L&D for disposal.. Uterine tone firm with massage, bleeding light afterwards Cytotec 200 mcg buccal and 400 mcg rectal for prophylaxis, Pitocin 40 units in 1L LR bolus infusing.  2nd degree perineal laceration, bilateral floor gutters and R peri labial extension identified.  Anesthesia:  epidural Repair:  3.0 Vicryl x 2, repaired in locked fashion on vaginal floor and perineal muscle approximated with interrupted sutures.  Friable vaginal floor tissue with slow oozing after repair, packing in place during recovery Est. Blood Loss (mL):500  Complications: mild shoulder dystocia relieved with McRobert's Other Labor Complications: intermittent late decelerations relieved with fluid bolus and O2, meconium stained amniotic fluid.  Mom to postpartum.  Baby to Couplet care / Skin to Skin.  Delivery Report:  Review the Delivery Report for details.     Signed: Neta Mends, CNM, MSN 11/27/2018, 5:35 AM

## 2018-09-24 ENCOUNTER — Inpatient Hospital Stay (HOSPITAL_COMMUNITY)
Admission: AD | Admit: 2018-09-24 | Discharge: 2018-09-25 | Disposition: A | Discharge status: Home / Self Care | Payer: Medicaid Other | Attending: Obstetrics and Gynecology | Admitting: Obstetrics and Gynecology

## 2018-09-24 ENCOUNTER — Encounter (HOSPITAL_COMMUNITY): Payer: Self-pay | Admitting: *Deleted

## 2018-09-24 DIAGNOSIS — A0811 Acute gastroenteropathy due to Norwalk agent: Secondary | ICD-10-CM | POA: Insufficient documentation

## 2018-09-24 DIAGNOSIS — R109 Unspecified abdominal pain: Secondary | ICD-10-CM | POA: Diagnosis present

## 2018-09-24 DIAGNOSIS — Z3402 Encounter for supervision of normal first pregnancy, second trimester: Secondary | ICD-10-CM

## 2018-09-24 DIAGNOSIS — O26893 Other specified pregnancy related conditions, third trimester: Secondary | ICD-10-CM | POA: Diagnosis not present

## 2018-09-24 DIAGNOSIS — Z3A31 31 weeks gestation of pregnancy: Secondary | ICD-10-CM

## 2018-09-24 DIAGNOSIS — Z3689 Encounter for other specified antenatal screening: Secondary | ICD-10-CM

## 2018-09-24 LAB — URINALYSIS, ROUTINE W REFLEX MICROSCOPIC
Bilirubin Urine: NEGATIVE
Glucose, UA: NEGATIVE mg/dL
Hgb urine dipstick: NEGATIVE
Ketones, ur: 80 mg/dL — AB
Leukocytes, UA: NEGATIVE
Nitrite: NEGATIVE
Protein, ur: 30 mg/dL — AB
Specific Gravity, Urine: 1.025 (ref 1.005–1.030)
pH: 5 (ref 5.0–8.0)

## 2018-09-24 MED ORDER — SODIUM CHLORIDE 0.9 % IV SOLN
8.0000 mg | Freq: Once | INTRAVENOUS | Status: DC
  Filled 2018-09-24: qty 4

## 2018-09-24 MED ORDER — LACTATED RINGERS IV BOLUS
1000.0000 mL | Freq: Once | INTRAVENOUS | Status: AC
  Administered 2018-09-24: 1000 mL via INTRAVENOUS

## 2018-09-24 MED ORDER — M.V.I. ADULT IV INJ
Freq: Once | INTRAVENOUS | Status: AC
  Administered 2018-09-24: 23:00:00 via INTRAVENOUS
  Filled 2018-09-24: qty 10

## 2018-09-24 MED ORDER — PROMETHAZINE HCL 25 MG/ML IJ SOLN
12.5000 mg | Freq: Once | INTRAMUSCULAR | Status: AC
  Administered 2018-09-24: 12.5 mg via INTRAVENOUS
  Filled 2018-09-24: qty 1

## 2018-09-24 NOTE — MAU Provider Note (Signed)
History     CSN: [id number]  Arrival date and time: 09/24/18 2034   First Provider Initiated Contact with Patient 09/24/18 2126      Chief Complaint  Patient presents with  . Abdominal Pain  . Emesis During Pregnancy   Jessica Ayala is a 23 y.o. G1P0 at 43w5dwho presents to MAU with complaints of N/V and Diarrhea that started occurring this afternoon around 1300. She reports nausea/vomiting started occurring first then when she went to vomit diarrhea started. She denies being able to keep food or liquids down since symptoms started occurring, reports emesis over 10 times and diarrhea over 15 times. She reports abdominal cramping is associated with diarrhea- rates pain 3/10, has not taken any medication for abdominal cramping. She denies being around anyone sick that she knows of. She reports taking Zofran at 2000 prior to presenting to MAU with has helped some with vomiting. She denies complications during this pregnancy.    OB History    Gravida  1   Para      Term      Preterm      AB      Living        SAB      TAB      Ectopic      Multiple      Live Births              Past Medical History:  Diagnosis Date  . Medical history non-contributory     Past Surgical History:  Procedure Laterality Date  . TONSILLECTOMY AND ADENOIDECTOMY    . tube in ear    . WISDOM TOOTH EXTRACTION      History reviewed. No pertinent family history.  Social History   Tobacco Use  . Smoking status: Never Smoker  . Smokeless tobacco: Never Used  Substance Use Topics  . Alcohol use: Never    Frequency: Never  . Drug use: Never    Allergies: No Known Allergies  Medications Prior to Admission  Medication Sig Dispense Refill Last Dose  . cyclobenzaprine (FLEXERIL) 10 MG tablet Take 1 tablet (10 mg total) by mouth every 8 (eight) hours as needed for muscle spasms. 30 tablet 1 Past Week at Unknown time  . ondansetron (ZOFRAN-ODT) 8 MG disintegrating tablet Take 8 mg by  mouth every 8 (eight) hours as needed for nausea or vomiting (PRN).   09/24/2018 at Unknown time  . ACCU-CHEK FASTCLIX LANCETS MISC 1 Units by Percutaneous route 4 (four) times daily. 100 each 12   . ACCU-CHEK FASTCLIX LANCETS MISC 1 Device by Percutaneous route 4 (four) times daily. 100 each 12   . Blood Glucose Monitoring Suppl (ACCU-CHEK GUIDE ME) w/Device KIT 1 Device by Does not apply route 4 (four) times daily. 1 kit 0   . glucose blood (ACCU-CHEK GUIDE) test strip Use for testing blood sugar four times a day 100 each 12     Review of Systems  Constitutional: Negative.   Respiratory: Negative.   Cardiovascular: Negative.   Gastrointestinal: Positive for abdominal pain, diarrhea, nausea and vomiting.  Genitourinary: Negative.    Physical Exam   Patient Vitals for the past 24 hrs:  BP Temp Temp src Pulse Resp SpO2 Height Weight  09/25/18 0038 (!) 108/56 99.4 F (37.4 C) Oral 97 16 - - -  09/24/18 2115 - 98 F (36.7 C) Oral - - - - -  09/24/18 2051 110/65 - - (!) 131 - 99 % '5\' 2"'  (  1.575 m) 83.5 kg   Physical Exam  Nursing note and vitals reviewed. Constitutional: She is oriented to person, place, and time. She appears well-developed and well-nourished. No distress.  Cardiovascular: Normal rate, regular rhythm and normal heart sounds.  Respiratory: Effort normal and breath sounds normal. No respiratory distress. She has no wheezes.  GI: Soft. There is no abdominal tenderness.  Gravid, appropriate for gestational age  Neurological: She is alert and oriented to person, place, and time.  Skin: There is pallor.   Dilation: Closed Effacement (%): Thick Cervical Position: Posterior Presentation: Vertex Exam by:: V Jaydah Stahle CNM    FHR: 150/ moderate/ +accels/ no decelerations  Toco: UI   MAU Course  Procedures  MDM Orders Placed This Encounter  Procedures  . Urinalysis, Routine w reflex microscopic   Meds ordered this encounter  Medications  . multivitamins adult (INFUVITE  ADULT) 10 mL in dextrose 5% lactated ringers 1,000 mL infusion  . lactated ringers bolus 1,000 mL  . promethazine (PHENERGAN) injection 12.5 mg   Results for orders placed or performed during the hospital encounter of 09/24/18 (from the past 24 hour(s))  Urinalysis, Routine w reflex microscopic     Status: Abnormal   Collection Time: 09/24/18  9:05 PM  Result Value Ref Range   Color, Urine YELLOW YELLOW   APPearance HAZY (A) CLEAR   Specific Gravity, Urine 1.025 1.005 - 1.030   pH 5.0 5.0 - 8.0   Glucose, UA NEGATIVE NEGATIVE mg/dL   Hgb urine dipstick NEGATIVE NEGATIVE   Bilirubin Urine NEGATIVE NEGATIVE   Ketones, ur 80 (A) NEGATIVE mg/dL   Protein, ur 30 (A) NEGATIVE mg/dL   Nitrite NEGATIVE NEGATIVE   Leukocytes, UA NEGATIVE NEGATIVE   RBC / HPF 0-5 0 - 5 RBC/hpf   WBC, UA 0-5 0 - 5 WBC/hpf   Bacteria, UA RARE (A) NONE SEEN   Squamous Epithelial / LPF 6-10 0 - 5   Mucus PRESENT    NST reactive  UA - ketones present in urine  Treatments in MAU included LR bolus, phenergan IV and banana bag.  Patient able to tolerate ice chips and crackers prior to discharge home, patient reports feeling better and denies N/V or diarrhea while in MAU.   Educated on symptoms of gastroenteritis and length of course, discussed reasons to return to MAU. Follow up as scheduled for prenatal appointments. Pt stable at time of discharge.   Assessment and Plan   1. Gastroenteritis due to norovirus   2. Encounter for supervision of normal first pregnancy in second trimester   3. [redacted] weeks gestation of pregnancy   4. NST (non-stress test) reactive    Discharge home  Follow up as scheduled for prenatal appointments  Return to MAU as needed  Continue taking Zofran at home as needed   [facility] [place] Follow up.   Specialty:  Obstetrics and Gynecology Why:  Follow up as scheduled for prenatal appointments and return to MAU as needed   Contact information: [address] [place] [id number] [phone]          Allergies as of 09/25/2018   No Known Allergies     Medication List    TAKE these medications   ACCU-CHEK FASTCLIX LANCETS Misc 1 Units by Percutaneous route 4 (four) times daily.   ACCU-CHEK FASTCLIX LANCETS Misc 1 Device by Percutaneous route 4 (four) times daily.   O[place]ta  w/Device Kit 1 Device by Does not apply route 4 (four) times daily.   cyclobenzaprine 10 MG tablet Commonly known as:  FLEXERIL Take 1 tablet (10 mg total) by mouth every 8 (eight) hours as needed for muscle spasms.   glucose blood test strip Commonly known as:  ACCU-CHEK GUIDE Use for testing blood sugar four times a day   ondansetron 8 MG disintegrating tablet Commonly known as:  ZOFRAN-ODT Take 8 mg by mouth every 8 (eight) hours as needed for nausea or vomiting (PRN).      Lajean Manes CNM  09/25/2018, 1:37 AM

## 2018-09-24 NOTE — MAU Note (Signed)
About 1300 started having abd cramping. Having n/v/d since then.Denies vag bleeding or d/c

## 2018-10-01 ENCOUNTER — Encounter: Payer: Self-pay | Admitting: Obstetrics & Gynecology

## 2018-10-01 ENCOUNTER — Ambulatory Visit (INDEPENDENT_AMBULATORY_CARE_PROVIDER_SITE_OTHER): Payer: Medicaid Other | Admitting: Obstetrics & Gynecology

## 2018-10-01 DIAGNOSIS — M545 Low back pain, unspecified: Secondary | ICD-10-CM

## 2018-10-01 DIAGNOSIS — Z3402 Encounter for supervision of normal first pregnancy, second trimester: Secondary | ICD-10-CM

## 2018-10-01 DIAGNOSIS — Z3403 Encounter for supervision of normal first pregnancy, third trimester: Secondary | ICD-10-CM

## 2018-10-01 DIAGNOSIS — Z3A32 32 weeks gestation of pregnancy: Secondary | ICD-10-CM

## 2018-10-01 DIAGNOSIS — O24419 Gestational diabetes mellitus in pregnancy, unspecified control: Secondary | ICD-10-CM | POA: Insufficient documentation

## 2018-10-01 DIAGNOSIS — O24410 Gestational diabetes mellitus in pregnancy, diet controlled: Secondary | ICD-10-CM

## 2018-10-01 MED ORDER — ACCU-CHEK GUIDE W/DEVICE KIT: 1.0000 | PACK | Freq: Four times a day (QID) | 0 refills | Status: DC

## 2018-10-01 MED FILL — ACCU-CHEK GUIDE TEST STRIP: 25 days supply | Qty: 100 | Fill #0

## 2018-10-01 MED FILL — ACCU-CHEK FASTCLIX LANCETS: 26 days supply | Qty: 102 | Fill #0

## 2018-10-01 NOTE — Patient Instructions (Signed)
iabetes Mellitus and Nutrition, Adult When you have diabetes (diabetes mellitus), it is very important to have healthy eating habits because your blood sugar (glucose) levels are greatly affected by what you eat and drink. Eating healthy foods in the appropriate amounts, at about the same times every day, can help you:  Control your blood glucose.  Lower your risk of heart disease.  Improve your blood pressure.  Reach or maintain a healthy weight. Every person with diabetes is different, and each person has different needs for a meal plan. Your health care provider may recommend that you work with a diet and nutrition specialist (dietitian) to make a meal plan that is best for you. Your meal plan may vary depending on factors such as:  The calories you need.  The medicines you take.  Your weight.  Your blood glucose, blood pressure, and cholesterol levels.  Your activity level.  Other health conditions you have, such as heart or kidney disease. How do carbohydrates affect me? Carbohydrates, also called carbs, affect your blood glucose level more than any other type of food. Eating carbs naturally raises the amount of glucose in your blood. Carb counting is a method for keeping track of how many carbs you eat. Counting carbs is important to keep your blood glucose at a healthy level, especially if you use insulin or take certain oral diabetes medicines. It is important to know how many carbs you can safely have in each meal. This is different for every person. Your dietitian can help you calculate how many carbs you should have at each meal and for each snack. Foods that contain carbs include:  Bread, cereal, rice, pasta, and crackers.  Potatoes and corn.  Peas, beans, and lentils.  Milk and yogurt.  Fruit and juice.  Desserts, such as cakes, cookies, ice cream, and candy. How does alcohol affect me? Alcohol can cause a sudden decrease in blood glucose (hypoglycemia),  especially if you use insulin or take certain oral diabetes medicines. Hypoglycemia can be a life-threatening condition. Symptoms of hypoglycemia (sleepiness, dizziness, and confusion) are similar to symptoms of having too much alcohol. If your health care provider says that alcohol is safe for you, follow these guidelines:  Limit alcohol intake to no more than 1 drink per day for nonpregnant women and 2 drinks per day for men. One drink equals 12 oz of beer, 5 oz of wine, or 1 oz of hard liquor.  Do not drink on an empty stomach.  Keep yourself hydrated with water, diet soda, or unsweetened iced tea.  Keep in mind that regular soda, juice, and other mixers may contain a lot of sugar and must be counted as carbs. What are tips for following this plan?  Reading food labels  Start by checking the serving size on the "Nutrition Facts" label of packaged foods and drinks. The amount of calories, carbs, fats, and other nutrients listed on the label is based on one serving of the item. Many items contain more than one serving per package.  Check the total grams (g) of carbs in one serving. You can calculate the number of servings of carbs in one serving by dividing the total carbs by 15. For example, if a food has 30 g of total carbs, it would be equal to 2 servings of carbs.  Check the number of grams (g) of saturated and trans fats in one serving. Choose foods that have low or no amount of these fats.  Check the number of  servings of carbs.  · Check the number of grams (g) of saturated and trans fats in one serving. Choose foods that have low or no amount of these fats.  · Check the number of milligrams (mg) of salt (sodium) in one serving. Most people should limit total sodium intake to less than 2,300 mg per day.  · Always check the nutrition information of foods labeled as "low-fat" or "nonfat". These foods may be higher in added sugar or refined carbs and should be avoided.  · Talk to your dietitian to identify your daily goals for nutrients listed on the label.  Shopping  · Avoid buying canned, premade, or processed foods. These foods tend to be high in fat, sodium,  and added sugar.  · Shop around the outside edge of the grocery store. This includes fresh fruits and vegetables, bulk grains, fresh meats, and fresh dairy.  Cooking  · Use low-heat cooking methods, such as baking, instead of high-heat cooking methods like deep frying.  · Cook using healthy oils, such as olive, canola, or sunflower oil.  · Avoid cooking with butter, cream, or high-fat meats.  Meal planning  · Eat meals and snacks regularly, preferably at the same times every day. Avoid going long periods of time without eating.  · Eat foods high in fiber, such as fresh fruits, vegetables, beans, and whole grains. Talk to your dietitian about how many servings of carbs you can eat at each meal.  · Eat 4-6 ounces (oz) of lean protein each day, such as lean meat, chicken, fish, eggs, or tofu. One oz of lean protein is equal to:  ? 1 oz of meat, chicken, or fish.  ? 1 egg.  ? ¼ cup of tofu.  · Eat some foods each day that contain healthy fats, such as avocado, nuts, seeds, and fish.  Lifestyle  · Check your blood glucose regularly.  · Exercise regularly as told by your health care provider. This may include:  ? 150 minutes of moderate-intensity or vigorous-intensity exercise each week. This could be brisk walking, biking, or water aerobics.  ? Stretching and doing strength exercises, such as yoga or weightlifting, at least 2 times a week.  · Take medicines as told by your health care provider.  · Do not use any products that contain nicotine or tobacco, such as cigarettes and e-cigarettes. If you need help quitting, ask your health care provider.  · Work with a counselor or diabetes educator to identify strategies to manage stress and any emotional and social challenges.  Questions to ask a health care provider  · Do I need to meet with a diabetes educator?  · Do I need to meet with a dietitian?  · What number can I call if I have questions?  · When are the best times to check my blood glucose?  Where to find more  information:  · American Diabetes Association: diabetes.org  · Academy of Nutrition and Dietetics: www.eatright.org  · National Institute of Diabetes and Digestive and Kidney Diseases (NIH): www.niddk.nih.gov  Summary  · A healthy meal plan will help you control your blood glucose and maintain a healthy lifestyle.  · Working with a diet and nutrition specialist (dietitian) can help you make a meal plan that is best for you.  · Keep in mind that carbohydrates (carbs) and alcohol have immediate effects on your blood glucose levels. It is important to count carbs and to use alcohol carefully.  This information is not intended to

## 2018-10-01 NOTE — Progress Notes (Signed)
   PRENATAL VISIT NOTE  Subjective:  Jessica Ayala is a 23 y.o. G1P0 at [redacted]w[redacted]d being seen today for ongoing prenatal care.  She is currently monitored for the following issues for this high-risk pregnancy and has Encounter for supervision of normal first pregnancy in second trimester; Gestational diabetes mellitus in pregnancy; and Lumbar back pain on their problem list.  Patient reports no complaints.  Contractions: Not present. Vag. Bleeding: None.  Movement: Present. Denies leaking of fluid.   The following portions of the patient's history were reviewed and updated as appropriate: allergies, current medications, past family history, past medical history, past social history, past surgical history and problem list. Problem list updated.  Objective:   Vitals:   10/01/18 1028  BP: 117/72  Pulse: 95  Weight: 187 lb (84.8 kg)    Fetal Status: Fetal Heart Rate (bpm): 155   Movement: Present     General:  Alert, oriented and cooperative. Patient is in no acute distress.  Skin: Skin is warm and dry. No rash noted.   Cardiovascular: Normal heart rate noted  Respiratory: Normal respiratory effort, no problems with respiration noted  Abdomen: Soft, gravid, appropriate for gestational age.  Pain/Pressure: Present     Pelvic: Cervical exam deferred        Extremities: Normal range of motion.  Edema: None  Mental Status: Normal mood and affect. Normal behavior. Normal judgment and thought content.   Assessment and Plan:  Pregnancy: G1P0 at [redacted]w[redacted]d  1. Encounter for supervision of normal first pregnancy in second trimester  2. Diet controlled gestational diabetes mellitus (GDM), antepartum Pt has not picked up glucose meter. Will go downstairs now to pick up.  Appt with diabetes educator on Monday (3 days) Reviewed diet. Pt does not eats 'green food or legumes) reviewed protein optons and methods to increase veggies   3. Lumbar back pain  Preterm labor symptoms and general obstetric  precautions including but not limited to vaginal bleeding, contractions, leaking of fluid and fetal movement were reviewed in detail with the patient. Please refer to After Visit Summary for other counseling recommendations.  Return in about 2 weeks (around 10/15/2018).  Future Appointments  Date Time Provider Department Center  10/04/2018 10:00 AM Carolan Shiver, RD [facility]    Willodean Rosenthal, MD

## 2018-10-01 NOTE — Progress Notes (Signed)
Patient states that she has not picked up her glucose monitor because of an issue. I informed her I sent a new script for the glucose monitor to her pharmacy since then but she states Vladimir Faster said they were awaiting on our reply. Patient also states she tried picking up from [facility].   Resent glucose monitor kit to [facility] and instructed she should pick up today. Patient is scheduled for diabetes management class on Monday Jan 13th. Kathrene Alu RN

## 2018-10-04 ENCOUNTER — Encounter: Payer: Medicaid Other | Attending: Family Medicine | Admitting: Registered"

## 2018-10-04 ENCOUNTER — Encounter: Payer: Self-pay | Admitting: Registered"

## 2018-10-04 DIAGNOSIS — O99810 Abnormal glucose complicating pregnancy: Secondary | ICD-10-CM | POA: Diagnosis present

## 2018-10-04 NOTE — Progress Notes (Signed)
Patient was seen on 10/04/18 for Gestational Diabetes self-management education at [facility]. The following learning objectives were met by the patient during this course:   States the definition of Gestational Diabetes  States why dietary management is important in controlling blood glucose  Describes the effects each nutrient has on blood glucose levels  Demonstrates ability to create a balanced meal plan  Demonstrates carbohydrate counting   States when to check blood glucose levels  Demonstrates proper blood glucose monitoring techniques  States the effect of stress and exercise on blood glucose levels  States the importance of limiting caffeine and abstaining from alcohol and smoking  Blood glucose monitor given: none   Patient instructed to monitor glucose levels: FBS: 60 - <95; 1 hour: <140; 2 hour: <120  Patient received handouts:  Nutrition Diabetes and Pregnancy, including carb counting list  Patient will be seen for follow-up as needed.

## 2018-10-12 ENCOUNTER — Encounter: Payer: Medicaid Other | Admitting: Advanced Practice Midwife

## 2018-10-21 ENCOUNTER — Encounter: Payer: Medicaid Other | Admitting: Family Medicine

## 2018-10-27 LAB — OB RESULTS CONSOLE GBS: GBS: NEGATIVE

## 2018-11-24 ENCOUNTER — Other Ambulatory Visit (HOSPITAL_COMMUNITY): Payer: Self-pay | Admitting: Certified Nurse Midwife

## 2018-11-24 DIAGNOSIS — O480 Post-term pregnancy: Secondary | ICD-10-CM

## 2018-11-25 ENCOUNTER — Ambulatory Visit (HOSPITAL_BASED_OUTPATIENT_CLINIC_OR_DEPARTMENT_OTHER)
Admission: RE | Admit: 2018-11-25 | Discharge: 2018-11-25 | Disposition: A | Discharge status: Home / Self Care | Payer: Medicaid Other | Source: Ambulatory Visit | Attending: Obstetrics and Gynecology | Admitting: Obstetrics and Gynecology

## 2018-11-25 DIAGNOSIS — O480 Post-term pregnancy: Secondary | ICD-10-CM

## 2018-11-25 DIAGNOSIS — Z3A40 40 weeks gestation of pregnancy: Secondary | ICD-10-CM | POA: Diagnosis not present

## 2018-11-26 ENCOUNTER — Inpatient Hospital Stay (HOSPITAL_COMMUNITY): Payer: Medicaid Other | Admitting: Anesthesiology

## 2018-11-26 ENCOUNTER — Encounter (HOSPITAL_COMMUNITY): Payer: Self-pay | Admitting: *Deleted

## 2018-11-26 ENCOUNTER — Inpatient Hospital Stay (HOSPITAL_COMMUNITY)
Admission: AD | Admit: 2018-11-26 | Discharge: 2018-11-28 | DRG: 807 | Disposition: A | Discharge status: Home / Self Care | Payer: Medicaid Other | Attending: Obstetrics | Admitting: Obstetrics

## 2018-11-26 ENCOUNTER — Other Ambulatory Visit: Payer: Self-pay

## 2018-11-26 DIAGNOSIS — O99344 Other mental disorders complicating childbirth: Secondary | ICD-10-CM | POA: Diagnosis present

## 2018-11-26 DIAGNOSIS — O4290 Premature rupture of membranes, unspecified as to length of time between rupture and onset of labor, unspecified weeks of gestation: Secondary | ICD-10-CM | POA: Diagnosis present

## 2018-11-26 DIAGNOSIS — D649 Anemia, unspecified: Secondary | ICD-10-CM | POA: Diagnosis present

## 2018-11-26 DIAGNOSIS — O9902 Anemia complicating childbirth: Secondary | ICD-10-CM | POA: Diagnosis present

## 2018-11-26 DIAGNOSIS — Z3A41 41 weeks gestation of pregnancy: Secondary | ICD-10-CM

## 2018-11-26 DIAGNOSIS — O4292 Full-term premature rupture of membranes, unspecified as to length of time between rupture and onset of labor: Secondary | ICD-10-CM | POA: Diagnosis present

## 2018-11-26 DIAGNOSIS — O480 Post-term pregnancy: Secondary | ICD-10-CM | POA: Diagnosis present

## 2018-11-26 DIAGNOSIS — F419 Anxiety disorder, unspecified: Secondary | ICD-10-CM | POA: Diagnosis present

## 2018-11-26 DIAGNOSIS — O24410 Gestational diabetes mellitus in pregnancy, diet controlled: Secondary | ICD-10-CM

## 2018-11-26 DIAGNOSIS — Z3402 Encounter for supervision of normal first pregnancy, second trimester: Secondary | ICD-10-CM

## 2018-11-26 DIAGNOSIS — M545 Low back pain, unspecified: Secondary | ICD-10-CM

## 2018-11-26 LAB — COMPREHENSIVE METABOLIC PANEL
ALT: 19 U/L (ref 0–44)
AST: 25 U/L (ref 15–41)
Albumin: 2.9 g/dL — ABNORMAL LOW (ref 3.5–5.0)
Alkaline Phosphatase: 134 U/L — ABNORMAL HIGH (ref 38–126)
Anion gap: 8 (ref 5–15)
BUN: 5 mg/dL — ABNORMAL LOW (ref 6–20)
CO2: 20 mmol/L — ABNORMAL LOW (ref 22–32)
Calcium: 8.9 mg/dL (ref 8.9–10.3)
Chloride: 108 mmol/L (ref 98–111)
Creatinine, Ser: 0.65 mg/dL (ref 0.44–1.00)
GFR calc Af Amer: >60 mL/min (ref >60)
GFR calc non Af Amer: >60 mL/min (ref >60)
Glucose, Bld: 81 mg/dL (ref 70–99)
Potassium: 3.8 mmol/L (ref 3.5–5.1)
Sodium: 136 mmol/L (ref 135–145)
Total Bilirubin: 0.5 mg/dL (ref 0.3–1.2)
Total Protein: 6.2 g/dL — ABNORMAL LOW (ref 6.5–8.1)

## 2018-11-26 LAB — CBC
HCT: 34.2 % — ABNORMAL LOW (ref 36.0–46.0)
Hemoglobin: 9.7 g/dL — ABNORMAL LOW (ref 12.0–15.0)
MCH: 19.5 pg — ABNORMAL LOW (ref 26.0–34.0)
MCHC: 28.4 g/dL — ABNORMAL LOW (ref 30.0–36.0)
MCV: 68.7 fL — AB (ref 80.0–100.0)
Platelets: 422 10*3/uL — ABNORMAL HIGH (ref 150–400)
RBC: 4.98 MIL/uL (ref 3.87–5.11)
RDW: 18 % — ABNORMAL HIGH (ref 11.5–15.5)
WBC: 9.4 10*3/uL (ref 4.0–10.5)
nRBC: 0 % (ref 0.0–0.2)

## 2018-11-26 LAB — TYPE AND SCREEN
ABO/RH(D): A POS
Antibody Screen: NEGATIVE

## 2018-11-26 LAB — ABO/RH: ABO/RH(D): A POS

## 2018-11-26 LAB — URIC ACID: Uric Acid, Serum: 3.9 mg/dL (ref 2.5–7.1)

## 2018-11-26 MED ORDER — ONDANSETRON HCL 4 MG/2ML IJ SOLN: 4.0000 mg | Freq: Four times a day (QID) | INTRAMUSCULAR | Status: DC | PRN

## 2018-11-26 MED ORDER — DIPHENHYDRAMINE HCL 50 MG/ML IJ SOLN: 12.5000 mg | INTRAMUSCULAR | Status: DC | PRN

## 2018-11-26 MED ORDER — PHENYLEPHRINE 40 MCG/ML (10ML) SYRINGE FOR IV PUSH (FOR BLOOD PRESSURE SUPPORT)
80.0000 ug | PREFILLED_SYRINGE | INTRAVENOUS | Status: DC | PRN
  Filled 2018-11-26: qty 10

## 2018-11-26 MED ORDER — SOD CITRATE-CITRIC ACID 500-334 MG/5ML PO SOLN: 30.0000 mL | ORAL | Status: DC | PRN

## 2018-11-26 MED ORDER — MISOPROSTOL 50MCG HALF TABLET
50.0000 ug | ORAL_TABLET | ORAL | Status: DC
  Administered 2018-11-26: 50 ug via BUCCAL
  Filled 2018-11-26: qty 1

## 2018-11-26 MED ORDER — LACTATED RINGERS IV SOLN: 500.0000 mL | Freq: Once | INTRAVENOUS | Status: DC

## 2018-11-26 MED ORDER — FENTANYL-BUPIVACAINE-NACL 0.5-0.125-0.9 MG/250ML-% EP SOLN: 12.0000 mL/h | EPIDURAL | Status: DC | PRN

## 2018-11-26 MED ORDER — EPHEDRINE 5 MG/ML INJ: 10.0000 mg | INTRAVENOUS | Status: DC | PRN

## 2018-11-26 MED ORDER — OXYTOCIN 40 UNITS IN NORMAL SALINE INFUSION - SIMPLE MED
2.5000 [IU]/h | INTRAVENOUS | Status: DC
  Filled 2018-11-26: qty 1000

## 2018-11-26 MED ORDER — LIDOCAINE HCL (PF) 1 % IJ SOLN: 30.0000 mL | INTRAMUSCULAR | Status: DC | PRN

## 2018-11-26 MED ORDER — FENTANYL-BUPIVACAINE-NACL 0.5-0.125-0.9 MG/250ML-% EP SOLN
EPIDURAL | Status: AC
  Filled 2018-11-26: qty 250

## 2018-11-26 MED ORDER — OXYTOCIN BOLUS FROM INFUSION
500.0000 mL | Freq: Once | INTRAVENOUS | Status: AC
  Administered 2018-11-27: 500 mL via INTRAVENOUS

## 2018-11-26 MED ORDER — ACETAMINOPHEN 325 MG PO TABS: 650.0000 mg | ORAL_TABLET | ORAL | Status: DC | PRN

## 2018-11-26 MED ORDER — PHENYLEPHRINE 40 MCG/ML (10ML) SYRINGE FOR IV PUSH (FOR BLOOD PRESSURE SUPPORT)
80.0000 ug | PREFILLED_SYRINGE | INTRAVENOUS | Status: DC | PRN
  Administered 2018-11-27: 80 ug via INTRAVENOUS

## 2018-11-26 MED ORDER — LACTATED RINGERS IV SOLN
500.0000 mL | INTRAVENOUS | Status: DC | PRN
  Administered 2018-11-27 (×2): 500 mL via INTRAVENOUS

## 2018-11-26 MED ORDER — TERBUTALINE SULFATE 1 MG/ML IJ SOLN: 0.2500 mg | Freq: Once | INTRAMUSCULAR | Status: DC | PRN

## 2018-11-26 MED ORDER — HYDROXYZINE HCL 50 MG PO TABS
50.0000 mg | ORAL_TABLET | Freq: Four times a day (QID) | ORAL | Status: DC | PRN
  Administered 2018-11-26 – 2018-11-27 (×2): 50 mg via ORAL
  Filled 2018-11-26 (×2): qty 1

## 2018-11-26 MED ORDER — OXYTOCIN 10 UNIT/ML IJ SOLN: 10.0000 [IU] | Freq: Once | INTRAMUSCULAR | Status: DC

## 2018-11-26 MED ORDER — OXYTOCIN 40 UNITS IN NORMAL SALINE INFUSION - SIMPLE MED
1.0000 m[IU]/min | INTRAVENOUS | Status: DC
  Administered 2018-11-26: 2 m[IU]/min via INTRAVENOUS
  Filled 2018-11-26: qty 1000

## 2018-11-26 NOTE — Progress Notes (Signed)
Patient ID: Jessica Ayala, female   DOB: 12/11/1995, 23 y.o.   MRN: [id number] S: Working with ctx, breathing and moaning. Used shower and nitrous for relief with moderate effect. Good support from family and doula.  Denies HA/RUQ pain. Nausea intermittent. PO hydration ongoing.     O: Vitals:   11/26/18 1608 11/26/18 1709 11/26/18 1846 11/26/18 1858  BP: (!) 149/87 134/90  138/88  Pulse: (!) 104 97  (!) 101  Resp:      Temp:  99.3 F (37.4 C)    TempSrc:  Oral    SpO2:   100%   Weight:      Height:         FHT:  FHR: 135 bpm, variability: moderate, IM with Doppler, no audible decels UC:   regular, every 2-3 minutes, palp moderate SVE:   Dilation: 4 Effacement (%): 80 Station: -1 Exam by:: Arlan Organ, CNM  AROM forebag, light mec  A / P: Spontaneous labor, progressing normally Labile BP, no neural sx Fetal Wellbeing:  Category I Pain Control:  Labor support without medications and Nitrous Oxide Plan water immersion when tub ready Anticipated MOD:  working towards NSVB  Neta Mends, CNM, MSN 11/26/2018, 8:38 PM

## 2018-11-26 NOTE — H&P (Signed)
OB ADMISSION/ HISTORY & PHYSICAL:  Admission Date: 11/26/2018 10:58 AM  Admit Diagnosis: pregnancy    Jessica Ayala is a 23 y.o. female presenting for PROM since 0645. Seen for labor triage at [facility] and noted to have elevated BP 140's/90's, no neural sx. Ctx q 3-4 min mild, patient unaware, SVE deferred, FHT cat1 in office. Gross ROM noted with clear fluid.   Patient counseled hospital admit for [facility] w/u and augmentation of labor in setting of unfavorable cvx.  Prenatal History: G1P0   EDC : 11/18/2018, by LMP c/ w sono  Prenatal care at [facility] since 35 wks, previous care at [place] and [place]   Prenatal course complicated by: 1. Hx anxiety, no meds 2. Elevated BP w/o neural sx 3. Atopic dermatitis / periorbital, tx with oral steroids x 2, now improved with topical clobetasol and claritin 4. BMI 30, TWG 26 lbs  Prenatal Labs: ABO, Rh:   A pos Antibody: neg Rubella:   immune RPR: Non Reactive (12/23 0908)  HBsAg:   neg HIV: Non Reactive (12/23 0908)  GBS:   neg 1 hr Glucola : 157, CBG screening wnl Genetic Screening: quad screen neg Ultrasound: normal anatomy, anterior placenta ANFT normal 41 wks, AFI 12.4  Medical / Surgical History :  Past medical history:  Past Medical History:  Diagnosis Date  . Medical history non-contributory      Past surgical history:  Past Surgical History:  Procedure Laterality Date  . TONSILLECTOMY AND ADENOIDECTOMY    . tube in ear    . WISDOM TOOTH EXTRACTION       Family History:  Family History  Problem Relation Age of Onset  . Anxiety disorder Mother   . Depression Mother   . Anxiety disorder Father   . Depression Father   . Asthma Sister      Social History:  reports that she has never smoked. She has never used smokeless tobacco. She reports that she does not drink alcohol or use drugs.   Allergies: Patient has no known allergies.   Current Medications at time of admission:  Medications  Prior to Admission  Medication Sig Dispense Refill Last Dose  . ACCU-CHEK FASTCLIX LANCETS MISC 1 Units by Percutaneous route 4 (four) times daily. 100 each 12   . ACCU-CHEK FASTCLIX LANCETS MISC 1 Device by Percutaneous route 4 (four) times daily. 100 each 12   . Blood Glucose Monitoring Suppl (ACCU-CHEK GUIDE) w/Device KIT 1 kit by Does not apply route 4 (four) times daily. 1 kit 0   . cyclobenzaprine (FLEXERIL) 10 MG tablet Take 1 tablet (10 mg total) by mouth every 8 (eight) hours as needed for muscle spasms. 30 tablet 1 Past Week at Unknown time  . glucose blood (ACCU-CHEK GUIDE) test strip Use for testing blood sugar four times a day 100 each 12   . ondansetron (ZOFRAN-ODT) 8 MG disintegrating tablet Take 8 mg by mouth every 8 (eight) hours as needed for nausea or vomiting (PRN).   09/24/2018 at Unknown time     Review of Systems: ROS Denies HA/NV/RUQ pain. Feels anxious, unaware of ctx. Physical Exam: Vital signs and nursing notes reviewed.  Vitals:   11/26/18 1242 11/26/18 1342  BP: 134/80 129/69  Pulse: 74 (!) 101  Resp:  16  Temp:      General: AAO x 3, NAD, coping better since in L&D and post one dose of hydroxyzine  Heart: RRR Lungs:CTAB Abdomen: Gravid, NT, Leopold's 7.5-8 lbs  Extremities: no edema Genitalia / VE:   1/thick/-1 posterior  FHR:  140 BPM, moderate variability, + accels, no decels TOCO: Ctx irregular  Labs:   Pending T&S, CBC, RPR  Recent Labs    11/26/18 1227  WBC 9.4  HGB 9.7*  HCT 34.2*  PLT 422*     Hepatic Function Latest Ref Rng & Units 11/26/2018  Total Protein 6.5 - 8.1 g/dL 6.2(L)  Albumin 3.5 - 5.0 g/dL 2.9(L)  AST 15 - 41 U/L 25  ALT 0 - 44 U/L 19  Alk Phosphatase 38 - 126 U/L 134(H)  Total Bilirubin 0.3 - 1.2 mg/dL 0.5     Assessment:  23 y.o. G1P0 at 55w1dLabile BP, suspect anxiety related  1. PROM, NIL 2. FHR category 1 3. GBS neg 4. Desires waterbirth 5. Breastfeeding planned 6. Placenta disposal per patient  request  Plan:  1. Admit to BS 2. Routine L&D orders 3. Analgesia/anesthesia PRN, hydrotherapy in active labor  4. Cervical ripening started with oral cytotec 50 mcg q 4 hrs, plan cervical balloon with next dose as needed 5. Working towards NSVB  Hydroxyzine 50 mg q 6 hrs PRN, close outpatient f/u for anxiety/depression  Dr TRonita Hippsnotified of admission / plan of care   DFontana MSN 11/26/2018, 1:39 PM

## 2018-11-26 NOTE — Anesthesia Pain Management Evaluation Note (Signed)
  [facility] Pain Management Visit Note  Patient: Jessica Ayala, 23 y.o., female  "Hello I am a member of the anesthesia team at [facility]. We have an anesthesia team available at all times to provide care throughout the hospital, including epidural management and anesthesia for C-section. I don't know your plan for the delivery whether it a natural birth, water birth, IV sedation, nitrous supplementation, doula or epidural, but we want to meet your pain goals."   1.Was your pain managed to your expectations on prior hospitalizations?   No prior hospitalizations  2.What is your expectation for pain management during this hospitalization?     Water tub and Doula  3.How can we help you reach that goal? Be available  Record the patient's initial score and the patient's pain goal.   Pain: 4  Pain Goal: 7 [facility] wants you to be able to say your pain was always managed very well.  [facility] 11/26/2018

## 2018-11-26 NOTE — Progress Notes (Signed)
Jessica Ayala is a 23 y.o. G1P0 at 52w1dby ultrasound admitted for PROM at 0645 and labile BP  Subjective:  Breathing w/ ctx, + nausea and one episode of emesis, anxiety ongoing, has good support from doula and partner Jessica Ayala.  Objective: Vitals:   11/26/18 1342 11/26/18 1448 11/26/18 1608 11/26/18 1709  BP: 129/69 (!) 144/84 (!) 149/87 134/90  Pulse: (!) 101 (!) 103 (!) 104 97  Resp: 16 20    Temp:  97.9 F (36.6 C)    TempSrc:  Axillary    Weight:      Height:         FHT:  FHR: 140 bpm, variability: moderate, no decles, IEFM UC:   regular, every 2-3 minutes SVE:   Dilation: 3 Effacement (%): 70 Station: -1 Exam by:: DMelina Copa CNM  Labs:   Recent Labs    11/26/18 1227  WBC 9.4  HGB 9.7*  HCT 34.2*  PLT 422*   Hepatic Function Latest Ref Rng & Units 11/26/2018  Total Protein 6.5 - 8.1 g/dL 6.2(L)  Albumin 3.5 - 5.0 g/dL 2.9(L)  AST 15 - 41 U/L 25  ALT 0 - 44 U/L 19  Alk Phosphatase 38 - 126 U/L 134(H)  Total Bilirubin 0.3 - 1.2 mg/dL 0.5   Assessment / Plan: Augmentation of labor, progressing well   Labor: S/P one dose oral cytotec with good response Will observe for physiologic labor for now, if ctx space out/slow down, rpt cytotec Preeclampsia:  no signs or symptoms of toxicity and labs stable Fetal Wellbeing:  Category I Pain Control:  Labor support without medications, may use nitrous PRN I/D:  GBS neg, afebrile  Anxiety: Hydroxyzine 50 mg q 6 hrs PRN Anticipated MOD:  working towards [place] CNM, MSN 11/26/2018, 5:29 PM

## 2018-11-26 NOTE — Progress Notes (Signed)
Patient ID: Aquetzalli Dub, female   DOB: 1996-03-19, 23 y.o.   MRN: [id number] S: Requesting epidural, states "I am done".  Trial of water immersion, water temp only 94, unable to increase duet to flow restriction.   O: Vitals:   11/26/18 1709 11/26/18 1846 11/26/18 1858 11/26/18 2028  BP: 134/90  138/88 (!) 136/91  Pulse: 97  (!) 101 100  Resp:      Temp: 99.3 F (37.4 C)   98.4 F (36.9 C)  TempSrc: Oral   Oral  SpO2:  100%    Weight:      Height:         FHT:  FHR: 135 bpm via doppler, no audible decels UC:   regular, every 2-3 minutes SVE:   Dilation: 4 Effacement (%): 90 Station: -1 Exam by:: D. Braidyn Peace CNM   A / P: Spontaneous labor, progressing normally  Fetal Wellbeing:  Category I Pain Control:  Water tub trial w/o significant progress, maternal exhaustion, will prep pt for epidural. Pitocin once patient comfortable Anticipated MOD:  working towards NSVB  Neta Mends, CNM, MSN 11/26/2018, 10:27 PM

## 2018-11-27 ENCOUNTER — Encounter (HOSPITAL_COMMUNITY): Payer: Self-pay

## 2018-11-27 LAB — RPR: RPR Ser Ql: NONREACTIVE

## 2018-11-27 MED ORDER — BENZOCAINE-MENTHOL 20-0.5 % EX AERO
1.0000 "application " | INHALATION_SPRAY | CUTANEOUS | Status: DC | PRN
  Filled 2018-11-27: qty 56

## 2018-11-27 MED ORDER — PRENATAL MULTIVITAMIN CH
1.0000 | ORAL_TABLET | Freq: Every day | ORAL | Status: DC
  Administered 2018-11-27 – 2018-11-28 (×2): 1 via ORAL
  Filled 2018-11-27 (×2): qty 1

## 2018-11-27 MED ORDER — MISOPROSTOL 200 MCG PO TABS
200.0000 ug | ORAL_TABLET | Freq: Once | ORAL | Status: AC
  Administered 2018-11-27: 200 ug via BUCCAL

## 2018-11-27 MED ORDER — FLEET ENEMA 7-19 GM/118ML RE ENEM: 1.0000 | ENEMA | Freq: Every day | RECTAL | Status: DC | PRN

## 2018-11-27 MED ORDER — ONDANSETRON HCL 4 MG PO TABS: 4.0000 mg | ORAL_TABLET | ORAL | Status: DC | PRN

## 2018-11-27 MED ORDER — DIBUCAINE 1 % RE OINT: 1.0000 "application " | TOPICAL_OINTMENT | RECTAL | Status: DC | PRN

## 2018-11-27 MED ORDER — COCONUT OIL OIL: 1.0000 "application " | TOPICAL_OIL | Status: DC | PRN

## 2018-11-27 MED ORDER — TETANUS-DIPHTH-ACELL PERTUSSIS 5-2.5-18.5 LF-MCG/0.5 IM SUSP: 0.5000 mL | Freq: Once | INTRAMUSCULAR | Status: DC

## 2018-11-27 MED ORDER — DOCUSATE SODIUM 100 MG PO CAPS
100.0000 mg | ORAL_CAPSULE | Freq: Two times a day (BID) | ORAL | Status: DC
  Administered 2018-11-27 – 2018-11-28 (×3): 100 mg via ORAL
  Filled 2018-11-27 (×3): qty 1

## 2018-11-27 MED ORDER — ONDANSETRON HCL 4 MG/2ML IJ SOLN: 4.0000 mg | INTRAMUSCULAR | Status: DC | PRN

## 2018-11-27 MED ORDER — ACETAMINOPHEN 325 MG PO TABS
650.0000 mg | ORAL_TABLET | ORAL | Status: DC | PRN
  Administered 2018-11-28: 650 mg via ORAL
  Filled 2018-11-27: qty 2

## 2018-11-27 MED ORDER — SIMETHICONE 80 MG PO CHEW: 80.0000 mg | CHEWABLE_TABLET | ORAL | Status: DC | PRN

## 2018-11-27 MED ORDER — BISACODYL 10 MG RE SUPP: 10.0000 mg | Freq: Every day | RECTAL | Status: DC | PRN

## 2018-11-27 MED ORDER — SODIUM CHLORIDE (PF) 0.9 % IJ SOLN
INTRAMUSCULAR | Status: DC | PRN
  Administered 2018-11-26: 12 mL/h via EPIDURAL

## 2018-11-27 MED ORDER — ACETAMINOPHEN 500 MG PO TABS
1000.0000 mg | ORAL_TABLET | Freq: Four times a day (QID) | ORAL | Status: DC | PRN
  Administered 2018-11-27: 1000 mg via ORAL
  Filled 2018-11-27: qty 2

## 2018-11-27 MED ORDER — IBUPROFEN 600 MG PO TABS
600.0000 mg | ORAL_TABLET | Freq: Four times a day (QID) | ORAL | Status: DC
  Administered 2018-11-27 – 2018-11-28 (×5): 600 mg via ORAL
  Filled 2018-11-27 (×6): qty 1

## 2018-11-27 MED ORDER — WITCH HAZEL-GLYCERIN EX PADS: 1.0000 "application " | MEDICATED_PAD | CUTANEOUS | Status: DC | PRN

## 2018-11-27 MED ORDER — MISOPROSTOL 200 MCG PO TABS
400.0000 ug | ORAL_TABLET | Freq: Once | ORAL | Status: AC
  Administered 2018-11-27: 400 ug via RECTAL

## 2018-11-27 MED ORDER — MISOPROSTOL 200 MCG PO TABS
ORAL_TABLET | ORAL | Status: AC
  Filled 2018-11-27: qty 3

## 2018-11-27 MED ORDER — LIDOCAINE HCL (PF) 1 % IJ SOLN
INTRAMUSCULAR | Status: DC | PRN
  Administered 2018-11-26: 6 mL via EPIDURAL

## 2018-11-27 MED ORDER — DIPHENHYDRAMINE HCL 25 MG PO CAPS: 25.0000 mg | ORAL_CAPSULE | Freq: Four times a day (QID) | ORAL | Status: DC | PRN

## 2018-11-27 MED ORDER — OXYCODONE HCL 5 MG PO TABS: 5.0000 mg | ORAL_TABLET | ORAL | Status: DC | PRN

## 2018-11-27 NOTE — Progress Notes (Signed)
Patient stable in PP unit, up to BR and voided, bleeding moderate.  VSSAF Vaginal packing removed, minimal bleeding observed.   Continue routine PP care.

## 2018-11-27 NOTE — Lactation Note (Signed)
This note was copied from a baby's chart. Lactation Consultation Note  Patient Name: Jessica Ayala [id number] Date: 11/27/2018 Reason for consult: Initial assessment;1st time breastfeeding;Primapara;Term;Other (Comment)(Mom is a [facility] patient, she voiced she didnd't have GDM during the pregnancy that her records did not transfer accurately)  8 hours old FT female who is being exclusively BF by his mother, she's a P1. Mom is a [facility] Patient, and she voiced that her records did not transfer correctly, she didn't have GDM during this pregnancy. Mom took BF classes at the birth center and she's already familiar with hand expression, her doula was with her in the room helping her latch baby on when entering.  LC offered assistance with latch since baby was shallow at the breast, mom was trying the side lying position because she voiced she couldn't do anything with her left arm where she had the IV. Mom complained she was in pain and very uncomfortable, reasussud her to call for assistance whenever is needed. LC reposition baby deep at the breast and a few audible swallows were noted, mom voiced no swallows were noted on previous feedings. Showed her how to do breast compressions and hand expression; she was able to get colostrum very easily. Reviewed normal newborn behavior and feeding cues.  Feeding plan:  1. Encouraged mom to feed baby STS 8-12 times/24 hours or sooner if feeding cues are present 2. Hand expression and spoon feeding was also encouraged  BF brochure and feeding diary were reviewed. Parents reported all questions and concerns were answered, they're both aware of LC services and will call PRN.  Maternal Data Formula Feeding for Exclusion: No Has patient been taught Hand Expression?: Yes Does the patient have breastfeeding experience prior to this delivery?: No  Feeding Feeding Type: Breast Fed  LATCH Score Latch: Grasps breast easily, tongue down, lips flanged,  rhythmical sucking.  Audible Swallowing: A few with stimulation  Type of Nipple: Everted at rest and after stimulation  Comfort (Breast/Nipple): Soft / non-tender  Hold (Positioning): Assistance needed to correctly position infant at breast and maintain latch.  LATCH Score: 8  Interventions Interventions: Breast feeding basics reviewed;Assisted with latch;Skin to skin;Breast massage;Hand express;Breast compression;Adjust position;Support pillows  Lactation Tools Discussed/Used WIC Program: No   Consult Status Consult Status: Follow-up Date: 11/28/18 Follow-up type: In-patient    Travus Oren Venetia Constable 11/27/2018, 12:52 PM

## 2018-11-27 NOTE — Anesthesia Postprocedure Evaluation (Signed)
Anesthesia Post Note  Patient: Foua Mantz  Procedure(s) Performed: AN AD HOC LABOR EPIDURAL     Patient location during evaluation: Mother Baby Anesthesia Type: Epidural Level of consciousness: awake and alert and oriented Pain management: satisfactory to patient Vital Signs Assessment: post-procedure vital signs reviewed and stable Respiratory status: respiratory function stable Cardiovascular status: stable Postop Assessment: no headache, no backache, epidural receding, patient able to bend at knees, no signs of nausea or vomiting and adequate PO intake Anesthetic complications: no    Last Vitals:  Vitals:   11/27/18 0600 11/27/18 0650  BP: 122/82 121/76  Pulse: 90 97  Resp:  16  Temp:  36.8 C  SpO2:  100%    Last Pain:  Vitals:   11/27/18 0650  TempSrc: Oral  PainSc: 0-No pain   Pain Goal:                   Juri Dinning

## 2018-11-27 NOTE — Anesthesia Preprocedure Evaluation (Signed)
Anesthesia Evaluation  Patient identified by MRN, date of birth, ID band Patient awake    Reviewed: Allergy & Precautions, H&P , NPO status , Patient's Chart, lab work & pertinent test results, reviewed documented beta blocker date and time   Airway Mallampati: I  TM Distance: >3 FB Neck ROM: full    Dental no notable dental hx.    Pulmonary neg pulmonary ROS,    Pulmonary exam normal breath sounds clear to auscultation       Cardiovascular negative cardio ROS Normal cardiovascular exam Rhythm:regular Rate:Normal     Neuro/Psych negative neurological ROS  negative psych ROS   GI/Hepatic negative GI ROS, Neg liver ROS,   Endo/Other  negative endocrine ROS  Renal/GU negative Renal ROS  negative genitourinary   Musculoskeletal   Abdominal   Peds  Hematology negative hematology ROS (+)   Anesthesia Other Findings   Reproductive/Obstetrics (+) Pregnancy                             Anesthesia Physical Anesthesia Plan  ASA: II  Anesthesia Plan: Epidural   Post-op Pain Management:    Induction:   PONV Risk Score and Plan:   Airway Management Planned:   Additional Equipment:   Intra-op Plan:   Post-operative Plan:   Informed Consent: I have reviewed the patients History and Physical, chart, labs and discussed the procedure including the risks, benefits and alternatives for the proposed anesthesia with the patient or authorized representative who has indicated his/her understanding and acceptance.   Dental Advisory Given  Plan Discussed with:   Anesthesia Plan Comments: (Labs checked- platelets confirmed with RN in room. Fetal heart tracing, per RN, reported to be stable enough for sitting procedure. Discussed epidural, and patient consents to the procedure:  included risk of possible headache,backache, failed block, allergic reaction, and nerve injury. This patient was asked if  she had any questions or concerns before the procedure started.)        Anesthesia Quick Evaluation  

## 2018-11-27 NOTE — Progress Notes (Signed)
CSW received consult for [name] due to history of anxiety and no current psychotropic medications. CSW met with [name], FOB Jessica Ayala, and Jessica Ayala at bedside to complete assessment. CSW obtained permission from [name] to complete with FOB present. CSW inquired with [name] regarding her history of anxiety, and [name] reports that she still experiences symptoms of anxiety. [name] reports that she was really scared of delivery and was anxious up until that time, but states now she is doing well. [name] reports that she copes with her anxiety by distracting herself with other activities including work and friends. [name] reports that she is tired but her mood is good. [name] reports a history of Zoloft and Lexapro use but stated that was multiple years ago and that the medication did not seem to help her. [name] reports that this is her first child. CSW educated parents on Jessica blues period versus postpartum depression. CSW explained the difference between the two and who to reach out to for assistance. [name] and FOB both agreed to be vigilant for signs or symptoms of postpartum depression. [name] denies the need for medication at this time. [name] denies any suicidal or homicidal thoughts.  Jessica Ayala, MSW, LCSW-A Clinical Social Worker [facility] [employer] [phone]

## 2018-11-27 NOTE — Anesthesia Procedure Notes (Signed)
Epidural Patient location during procedure: OB Start time: 11/26/2018 10:47 PM End time: 11/26/2018 10:53 PM  Staffing Anesthesiologist: Bethena Midget, MD  Preanesthetic Checklist Completed: patient identified, site marked, surgical consent, pre-op evaluation, timeout performed, IV checked, risks and benefits discussed and monitors and equipment checked  Epidural Patient position: sitting Prep: site prepped and draped and DuraPrep Patient monitoring: continuous pulse ox and blood pressure Approach: midline Location: L4-L5 Injection technique: LOR air  Needle:  Needle type: Tuohy  Needle gauge: 17 G Needle length: 9 cm and 9 Needle insertion depth: 6 cm Catheter type: closed end flexible Catheter size: 19 Gauge Catheter at skin depth: 11 cm Test dose: negative  Assessment Events: blood not aspirated, injection not painful, no injection resistance, negative IV test and no paresthesia

## 2018-11-27 NOTE — Progress Notes (Signed)
Patient ID: Jessica Ayala, female   DOB: [birth year], 23 y.o.   MRN: [id number]  S: Comfortable post epidural.  Started Pitocin for augmentation off dystotic pattern, laboring in L and R lateral postions with exaggerated Simms / peanut ball.   O:  Vitals:   11/27/18 0205 11/27/18 0230  BP: 116/62 116/61  Pulse: (!) 109 (!) 103  Resp:    Temp:    SpO2:     Temp 99.3  Pitocin 6 mu/min  FHR 145, minimal to moderate variability, no accels past 40 min, intermittent periods of late decles Toco: q 1-4 min, 40-50 sec, runs of q 1 min ctx with breaks of 4-5 min  GU deferred Foley cath draining yellow urine  A/P: Dystotic labor pattern FHT category 2 Pain - epidural effective BP low since epidural and mild maternal tachy  Fluid bolus 500 cc APAP 1 gm Decrease Pitocin to 3 mu/min Monitor closely for hypotension and chorio Continue with rotating positioning  Neta Mends, MSN, CNM 11/27/2018, 3:00 AM

## 2018-11-28 DIAGNOSIS — O9902 Anemia complicating childbirth: Secondary | ICD-10-CM | POA: Diagnosis present

## 2018-11-28 LAB — CBC
HCT: 25.6 % — ABNORMAL LOW (ref 36.0–46.0)
Hemoglobin: 7.3 g/dL — ABNORMAL LOW (ref 12.0–15.0)
MCH: 19.6 pg — ABNORMAL LOW (ref 26.0–34.0)
MCHC: 28.5 g/dL — ABNORMAL LOW (ref 30.0–36.0)
MCV: 68.6 fL — ABNORMAL LOW (ref 80.0–100.0)
Platelets: 371 10*3/uL (ref 150–400)
RBC: 3.73 MIL/uL — ABNORMAL LOW (ref 3.87–5.11)
RDW: 18.4 % — ABNORMAL HIGH (ref 11.5–15.5)
WBC: 12.8 10*3/uL — AB (ref 4.0–10.5)
nRBC: 0 % (ref 0.0–0.2)

## 2018-11-28 MED ORDER — POLYSACCHARIDE IRON COMPLEX 150 MG PO CAPS
150.0000 mg | ORAL_CAPSULE | Freq: Every day | ORAL | Status: DC
  Administered 2018-11-28: 150 mg via ORAL
  Filled 2018-11-28: qty 1

## 2018-11-28 MED ORDER — ACETAMINOPHEN 325 MG PO TABS: 650.0000 mg | ORAL_TABLET | ORAL | Status: AC | PRN

## 2018-11-28 MED ORDER — COCONUT OIL OIL: 1.0000 "application " | TOPICAL_OIL | 0 refills | Status: AC | PRN

## 2018-11-28 MED ORDER — SODIUM CHLORIDE 0.9 % IV SOLN
510.0000 mg | Freq: Once | INTRAVENOUS | Status: AC
  Administered 2018-11-28: 510 mg via INTRAVENOUS
  Filled 2018-11-28 (×2): qty 17

## 2018-11-28 MED ORDER — BENZOCAINE-MENTHOL 20-0.5 % EX AERO: 1.0000 "application " | INHALATION_SPRAY | CUTANEOUS | Status: AC | PRN

## 2018-11-28 MED ORDER — MAGNESIUM OXIDE 400 (241.3 MG) MG PO TABS: 400.0000 mg | ORAL_TABLET | Freq: Every day | ORAL | Status: AC

## 2018-11-28 MED ORDER — FERROUS SULFATE 325 (65 FE) MG PO TABS
325.0000 mg | ORAL_TABLET | Freq: Two times a day (BID) | ORAL | Status: DC
  Filled 2018-11-28: qty 1

## 2018-11-28 MED ORDER — MAGNESIUM OXIDE 400 (241.3 MG) MG PO TABS
400.0000 mg | ORAL_TABLET | Freq: Every day | ORAL | Status: DC
  Administered 2018-11-28: 400 mg via ORAL
  Filled 2018-11-28: qty 1

## 2018-11-28 MED ORDER — POLYSACCHARIDE IRON COMPLEX 150 MG PO CAPS: 150.0000 mg | ORAL_CAPSULE | Freq: Every day | ORAL | Status: AC

## 2018-11-28 MED ORDER — IBUPROFEN 600 MG PO TABS: 600.0000 mg | ORAL_TABLET | Freq: Four times a day (QID) | ORAL | 0 refills | Status: AC

## 2018-11-28 NOTE — Progress Notes (Signed)
Patient ID: Jessica Ayala, female   DOB: [birth year], 23 y.o.   MRN: [id number]  PPD # 1 S/P NSVD  Live born female  Birth Weight: 7 lb 9.3 oz (3439 g) APGAR: 7, 9  Newborn Delivery   Time head delivered:  11/27/2018 04:34:00 Birth date/time:  11/27/2018 04:34:00 Delivery type:  Vaginal, Spontaneous    Baby name: Zeke Delivering provider: Arlan Organ C  Episiotomy:None   Lacerations:2nd degree   circumcision declined  Feeding: breast  Pain control at delivery: Epidural   S:  Reports feeling tired, unable to rest in hospital bed, desires discharge home today.              Tolerating po/ No nausea or vomiting             Bleeding is decreased.             Pain controlled with ibuprofen (OTC)             Up ad lib / ambulatory / voiding without difficulties   O:  A & O x 3, in no apparent distress              VS:  Vitals:   11/27/18 1206 11/27/18 1816 11/27/18 2111 11/28/18 0531  BP: 133/83 126/77 133/82 125/72  Pulse: 100 (!) 106 86 87  Resp:  18 18 18   Temp: 99.5 F (37.5 C) 99.2 F (37.3 C) 98.5 F (36.9 C) 98 F (36.7 C)  TempSrc: Oral Oral Oral Oral  SpO2:      Weight:      Height:        LABS:  Recent Labs    11/26/18 1227 11/28/18 0432  WBC 9.4 12.8*  HGB 9.7* 7.3*  HCT 34.2* 25.6*  PLT 422* 371    Blood type: --/--/A POS, A POS Performed at [facility], [address], [place], [place] [address]  (03/06 1227)  Rubella: Immune (08/05 0000)   I&O: I/O last 3 completed shifts: In: -  Out: 800 [Urine:300; Blood:500]          No intake/output data recorded.   Lungs: Clear and unlabored  Heart: regular rate and rhythm / no murmurs  Abdomen: soft, non-tender, non-distended             Fundus: firm, non-tender, U-1  Perineum: mild edema, repair intact  Lochia: small  Extremities: trace pedal edema, no calf pain or tenderness    A/P: PPD # 1 22 y.o., G1P1001   Principal Problem:   Postpartum care following vaginal delivery 3/7 Active Problems:   PROM (premature rupture of membranes)   SVD (spontaneous vaginal delivery)   Perineal laceration, second degree, and R labial   Maternal anemia, with delivery  - asymptomatic  - plan restart IV and give parenteral Feraheme one dose prior to discharge  - continue oral supplement Ferrex and Mag ox Anxiety - stable off meds - has good support at home from FOB and her mother - risk of PPD discussed, patient advised of warning signs  NO GDM - patient failed 1hr GTT, but subsequent fasting and 1 hr PP glucose screens normal   Doing well - stable status  Routine post partum orders  DC home today pending newborn ZDC from pediatric service  F/U closely at [facility] in 2 and 6 wks PP    Neta Mends, MSN, CNM 11/28/2018, 10:46 AM

## 2018-11-28 NOTE — Lactation Note (Signed)
This note was copied from a baby's chart. Lactation Consultation Note  Patient Name: Jessica Ayala TCYEL'Y Date: 11/28/2018 Reason for consult: Follow-up assessment   P1, Baby 31 hours old.  Mother has semi flat nipples that are tender.  No cracks or abrasions. Assisted w/ sandwiching breast to achieve depth.  Mother was pulling tissue back away from infant's nose.  Provided education. Mother requesting trying a nipple shield for soreness but she stated it felt the same. She states she thinks she is sore from pulling breast tissue back. For soreness suggest mother apply ebm or coconut oil while wearing shells and alternate with comfort gels. Feed on demand approximately 8-12 times per day.      Maternal Data Has patient been taught Hand Expression?: Yes  Feeding Feeding Type: Breast Fed  LATCH Score Latch: Grasps breast easily, tongue down, lips flanged, rhythmical sucking.  Audible Swallowing: A few with stimulation  Type of Nipple: Everted at rest and after stimulation  Comfort (Breast/Nipple): Filling, red/small blisters or bruises, mild/mod discomfort  Hold (Positioning): Assistance needed to correctly position infant at breast and maintain latch.  LATCH Score: 7  Interventions Interventions: Breast feeding basics reviewed;Assisted with latch;Skin to skin;Breast massage;Hand express;Breast compression;Coconut oil;Shells;Comfort gels  Lactation Tools Discussed/Used Tools: Pump;Comfort gels;Shells Shell Type: Inverted Breast pump type: Manual Pump Review: Milk Storage;Setup, frequency, and cleaning Initiated by:: MAI  Date initiated:: 11/28/18   Consult Status Consult Status: Follow-up Date: 11/29/18 Follow-up type: In-patient    Dahlia Byes [facility] 11/28/2018, 12:32 PM

## 2018-11-28 NOTE — Lactation Note (Signed)
This note was copied from a baby's chart. Lactation Consultation Note  Patient Name: Jessica Ayala Date: 11/28/2018 Reason for consult: Follow-up assessment;1st time breastfeeding;Primapara;Term;Nipple pain/trauma  Baby is 63 hours old  Per mom the baby last fed at 8:15 am and 8:42 for 10 mins each.  Baby fussy at consult / dad changed a wet diaper/ and since the baby is awake  LC assessed tongue due to mom telling LC she is sore when baby latches / more in the cradle/ then when she uses The football position. LC showed mom and dad the cross cradle and how to obtain the depth/ baby swallows several Times and released and didn't seem hungry.  LC assessed breast tissue and the nipples are semi flat and the areolas are compressible. LC reviewed hand expressing  And the colostrum flows easily. LC encouraged mom to use her colostrum on her nipples liberally.  LC recommended and instructed mom prior to latch - breast massage/ hand express/ pre-pump with hand pump to prime The milk ducts and latch by sandwiching the tissue. LC showed dad how he can help mom to latch and obtain depth.  LC instructed on the use of hand pump / shells / and comfort gels x 6 days.  Sore nipple and engorgement prevention and tx reviewed.  Per mom plans to keep I/O 's record on a app. On her phone.  Mother informed of post-discharge support and given phone number to the lactation department, including services for phone call assistance; out-patient appointments; and breastfeeding support group. List of other breastfeeding resources in the community given in the handout. Encouraged mother to call for problems or concerns related to breastfeeding.  MBURN mentioned to [facility] baby probably  wasn't being D/C .     Maternal Data Has patient been taught Hand Expression?: Yes  Feeding Feeding Type: Breast Fed  LATCH Score Latch: Grasps breast easily, tongue down, lips flanged, rhythmical sucking.  Audible Swallowing:  Spontaneous and intermittent  Type of Nipple: Flat(compressible areolas )  Comfort (Breast/Nipple): Soft / non-tender  Hold (Positioning): Assistance needed to correctly position infant at breast and maintain latch.  LATCH Score: 8  Interventions Interventions: Breast feeding basics reviewed;Assisted with latch;Skin to skin;Breast massage;Hand express;Breast compression;Adjust position;Support pillows;Position options;Shells;Comfort gels;Hand pump  Lactation Tools Discussed/Used Tools: Pump;Comfort gels;Shells Shell Type: Inverted Breast pump type: Manual Pump Review: Milk Storage;Setup, frequency, and cleaning Initiated by:: MAI  Date initiated:: 11/28/18   Consult Status Consult Status: Follow-up Date: 11/29/18 Follow-up type: In-patient    Jessica Ayala 11/28/2018, 10:24 AM

## 2018-11-28 NOTE — Discharge Summary (Signed)
OB Discharge Summary  Patient Name: Jessica Ayala DOB: 1995-12-06 MRN: [id number]  Date of admission: 11/26/2018 Delivering provider: Juliene Pina   Date of discharge: 11/28/2018  Admitting diagnosis: pregnancy Intrauterine pregnancy: [redacted]w[redacted]d    Secondary diagnosis:Principal Problem:   Postpartum care following vaginal delivery 3/7 Active Problems:   PROM (premature rupture of membranes)   SVD (spontaneous vaginal delivery)   Perineal laceration, second degree, and R labial   Maternal anemia, with delivery  Additional problems: history of anxiety     Discharge diagnosis:  Patient Active Problem List   Diagnosis Date Noted  . Maternal anemia, with delivery 11/28/2018  . Postpartum care following vaginal delivery 3/7 11/27/2018  . SVD (spontaneous vaginal delivery) 11/27/2018  . Perineal laceration, second degree, and R labial 11/27/2018  . PROM (premature rupture of membranes) 11/26/2018                                                                Post partum procedures: Parenteral iron  Augmentation: Pitocin and Cytotec Pain control: Epidural  Laceration:2nd degree  Episiotomy:None  Complications: None   Hospital course:  Onset of Labor With Vaginal Delivery     23y.o. yo G1P1001 at 438w2das admitted in Latent Labor on 11/26/2018. [facility] and patient found to have premature rupture of membranes and elevated blood pressure therefore risked out of birth center delivery. Patient had a labor course as follows: Buccal Cytotec for cervical ripening x 1, used continuous labor support / doula, nitrous oxide gas, and hydrotherapy in early labor. Blood pressure labile in normal to mild range, no neural symptoms and normal preeclamptic labs on admitting. Meconium stained fluid noted with rupture of amniotic forebag in active labor. Patient received an epidural in active labor. Fetal heart tracing category 2 in transition stage of labor with  intermittent late decelerations.  Membrane Rupture Time/Date: 6:40 AM ,11/26/2018   Intrapartum Procedures: Episiotomy: None [1]                                         Lacerations:  2nd degree [3]  Patient had a delivery of a Viable infant. 11/27/2018  Information for the patient's newborn:  CrRosabell, Geyer0[[id number]]Delivery Method: Vaginal, Spontaneous(Filed from Delivery Summary)    Pateint had an uncomplicated postpartum course.  She is ambulating, tolerating a regular diet, passing flatus, and urinating well. Patient is discharged home in stable condition on 11/28/18.   Physical exam  Vitals:   11/27/18 1206 11/27/18 1816 11/27/18 2111 11/28/18 0531  BP: 133/83 126/77 133/82 125/72  Pulse: 100 (!) 106 86 87  Resp:  '18 18 18  ' Temp: 99.5 F (37.5 C) 99.2 F (37.3 C) 98.5 F (36.9 C) 98 F (36.7 C)  TempSrc: Oral Oral Oral Oral  SpO2:      Weight:      Height:       General: alert, cooperative and no distress Lochia: appropriate Uterine Fundus: firm Incision: Healing well with no significant drainage DVT Evaluation: No cords or calf tenderness. No significant calf/ankle edema. Labs: Lab Results  Component Value Date   WBC  12.8 (H) 11/28/2018   HGB 7.3 (L) 11/28/2018   HCT 25.6 (L) 11/28/2018   MCV 68.6 (L) 11/28/2018   PLT 371 11/28/2018   CMP Latest Ref Rng & Units 11/26/2018  Glucose 70 - 99 mg/dL 81  Ayala 6 - 20 mg/dL 5(L)  Creatinine 0.44 - 1.00 mg/dL 0.65  Sodium 135 - 145 mmol/L 136  Potassium 3.5 - 5.1 mmol/L 3.8  Chloride 98 - 111 mmol/L 108  CO2 22 - 32 mmol/L 20(L)  Calcium 8.9 - 10.3 mg/dL 8.9  Total Protein 6.5 - 8.1 g/dL 6.2(L)  Total Bilirubin 0.3 - 1.2 mg/dL 0.5  Alkaline Phos 38 - 126 U/L 134(H)  AST 15 - 41 U/L 25  ALT 0 - 44 U/L 19    Vaccines: TDaP UTD         Flu    UTD  Discharge instruction: per After Visit Summary and "Baby and Me Booklet".  After Visit Meds:  Allergies as of 11/28/2018   No Known Allergies     Medication List     STOP taking these medications   Accu-Chek FastClix Lancets Misc   Accu-Chek Guide w/Device Kit   glucose blood test strip Commonly known as:  Accu-Chek Guide     TAKE these medications   acetaminophen 325 MG tablet Commonly known as:  Tylenol Take 2 tablets (650 mg total) by mouth every 4 (four) hours as needed (for pain scale < 4).   benzocaine-Menthol 20-0.5 % Aero Commonly known as:  DERMOPLAST Apply 1 application topically as needed for irritation (perineal discomfort).   coconut oil Oil Apply 1 application topically as needed.   cyclobenzaprine 10 MG tablet Commonly known as:  FLEXERIL Take 1 tablet (10 mg total) by mouth every 8 (eight) hours as needed for muscle spasms.   ibuprofen 600 MG tablet Commonly known as:  ADVIL,MOTRIN Take 1 tablet (600 mg total) by mouth every 6 (six) hours.   iron polysaccharides 150 MG capsule Commonly known as:  NIFEREX Take 1 capsule (150 mg total) by mouth daily.   magnesium oxide 400 (241.3 Mg) MG tablet Commonly known as:  MAG-OX Take 1 tablet (400 mg total) by mouth daily.   ondansetron 8 MG disintegrating tablet Commonly known as:  ZOFRAN-ODT Take 8 mg by mouth every 8 (eight) hours as needed for nausea or vomiting (PRN).            Discharge Care Instructions  (From admission, onward)         Start     Ordered   11/28/18 0000  Discharge wound care:    Comments:  Sitz baths 2 times /day with warm water x 1 week   11/28/18 1143          Diet: iron rich  Activity: Advance as tolerated. Pelvic rest for 6 weeks.   Postpartum contraception: Not Discussed  Newborn Data: Live born female  Birth Weight: 7 lb 9.3 oz (3439 g) APGAR: 7, 9  Newborn Delivery   Time head delivered:  11/27/2018 04:34:00 Birth date/time:  11/27/2018 04:34:00 Delivery type:  Vaginal, Spontaneous     named Ezekiel Baby Feeding: Breast Disposition:home with mother   Delivery Report:  Review the Delivery Report for details.     Follow up: Follow-up Information    Juliene Pina, CNM. Schedule an appointment as soon as possible for a visit in 2 week(s).   Specialty:  Obstetrics and Gynecology Contact information: [address] [place] [address] [phone]  Signed: Juliene Pina, CNM, MSN 11/28/2018, 11:46 AM

## 2018-11-29 ENCOUNTER — Ambulatory Visit: Payer: Self-pay

## 2018-11-29 NOTE — Lactation Note (Signed)
This note was copied from a baby's chart. Lactation Consultation Note  Patient Name: Jessica Ayala VOHYW'V Date: 11/29/2018 Reason for consult: Follow-up assessment;Primapara;Term Baby is 52 hours old/4% weight loss.  No stool documented.  Mom states feedings are going much better.  Baby is latching easily and mom comfortable with feeding.  Breasts are filling.  Reviewed engorgement prevention and treatment.  She has a breast pump at home.  Lactation outpatient services and support reviewed and encouraged.  Maternal Data    Feeding Feeding Type: Breast Milk  LATCH Score                   Interventions    Lactation Tools Discussed/Used     Consult Status Consult Status: Complete Follow-up type: Call as needed    Huston Foley 11/29/2018, 8:54 AM

## 2020-02-07 IMAGING — US US MFM OB FOLLOW-UP
1 series · 14 of 28 positions shown · non-contrast
Comparison: none

[Series 1: us mfm ob follow-up · 14 of 58 slices shown]
[im 3/58]
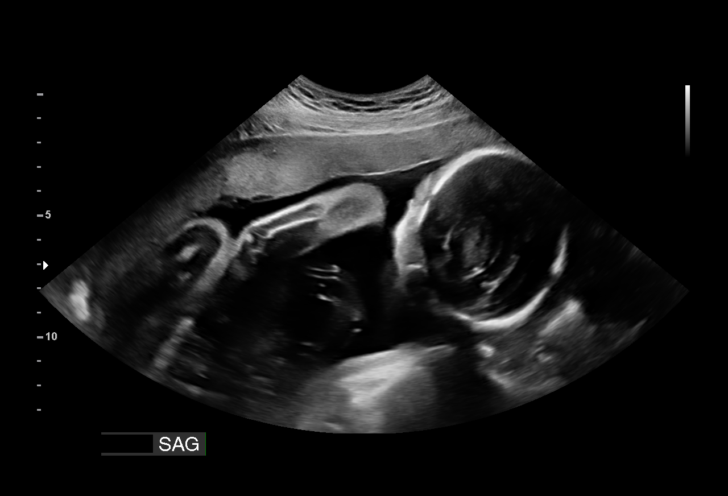
[im 7/58]
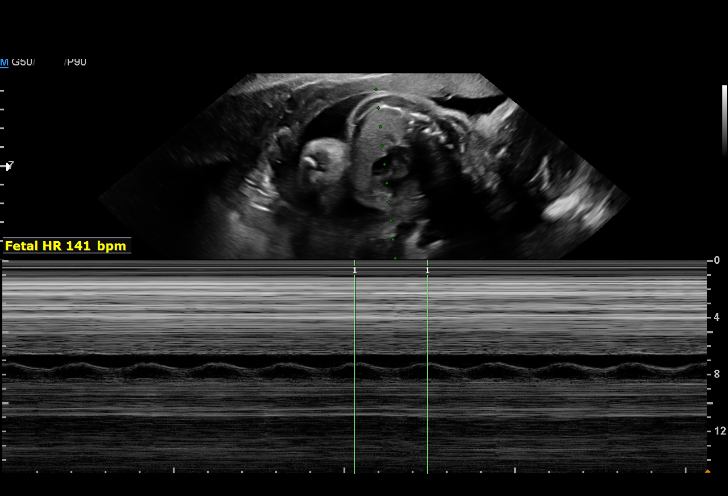
[im 11/58]
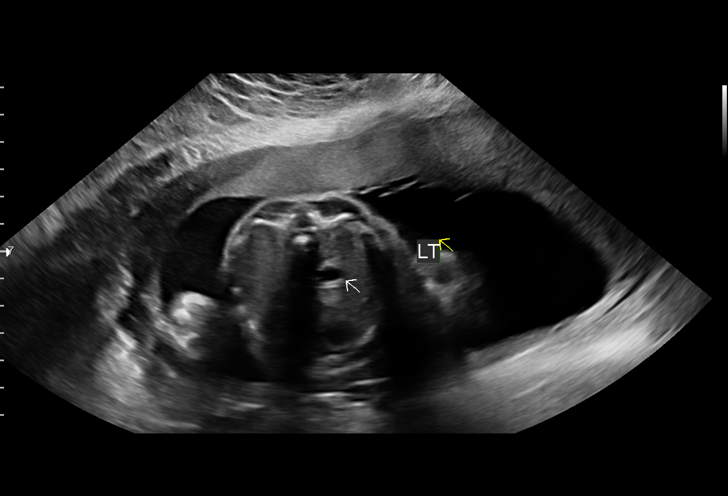
[im 15/58]
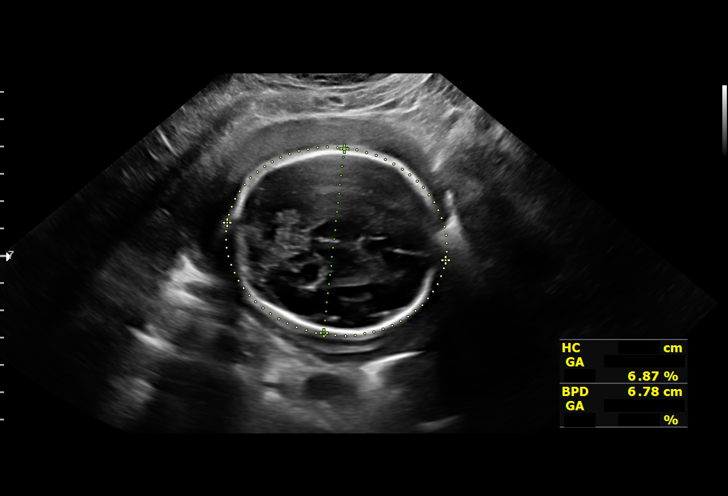
[im 20/58]
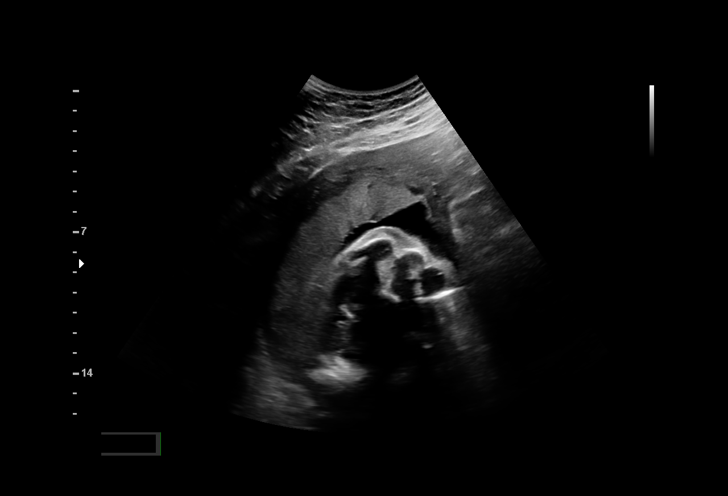
[im 24/58]
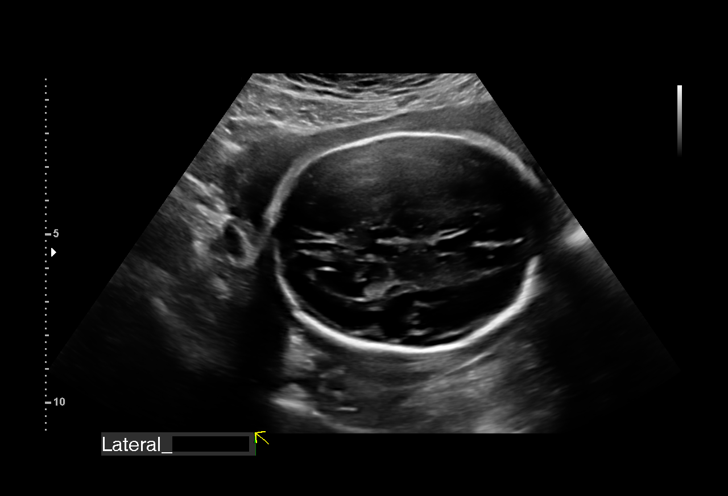
[im 28/58]
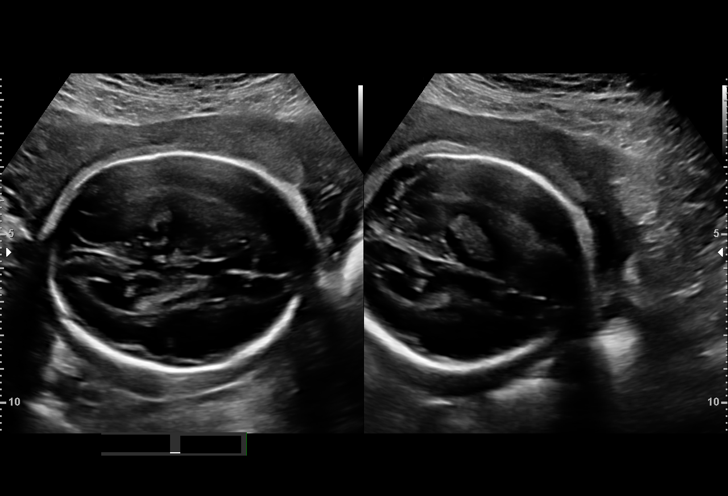
[im 32/58]
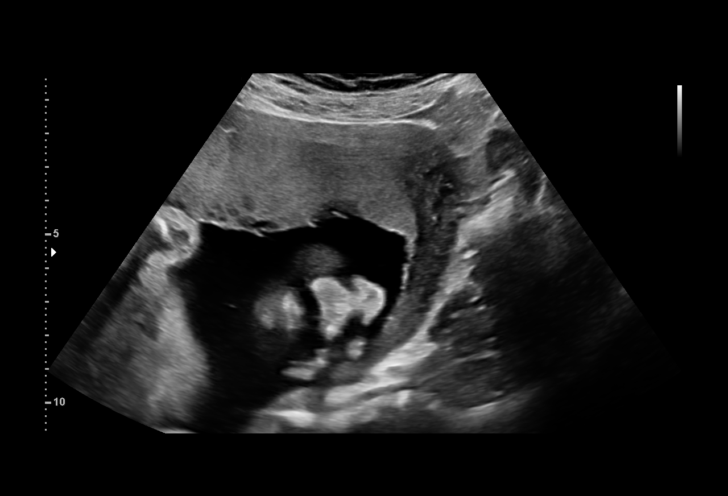
[im 36/58]
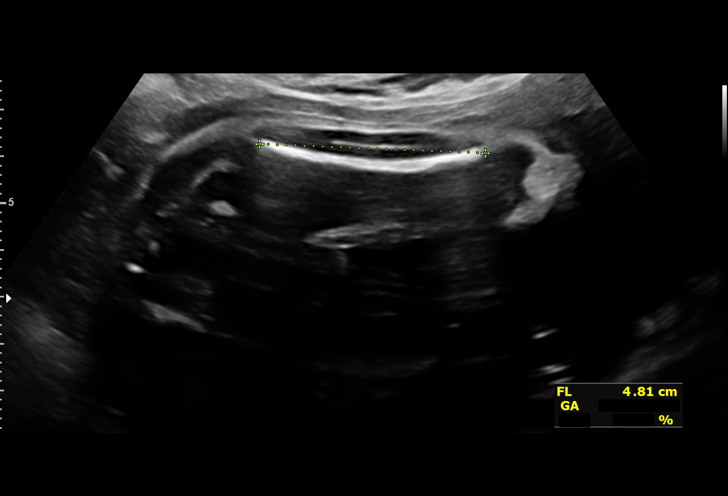
[im 41/58]
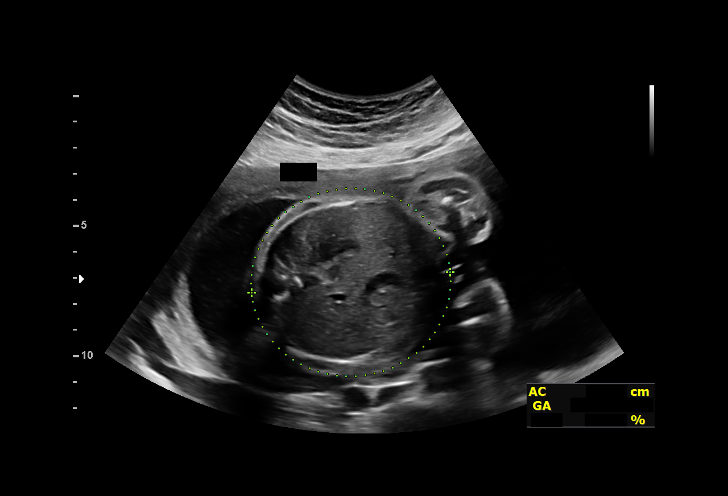
[im 45/58]
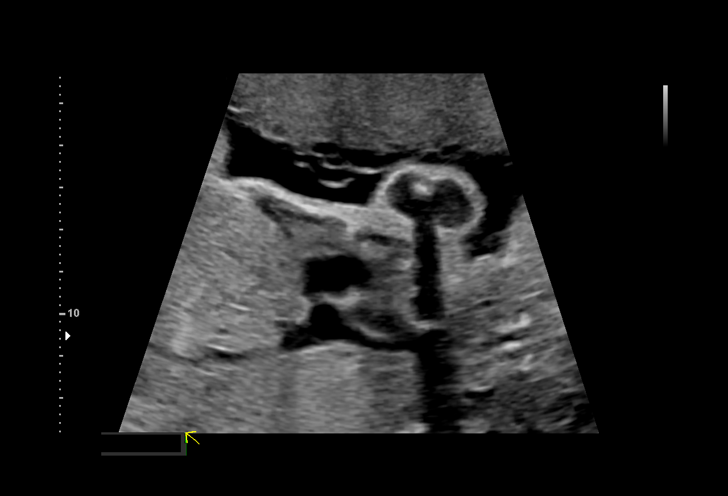
[im 49/58]
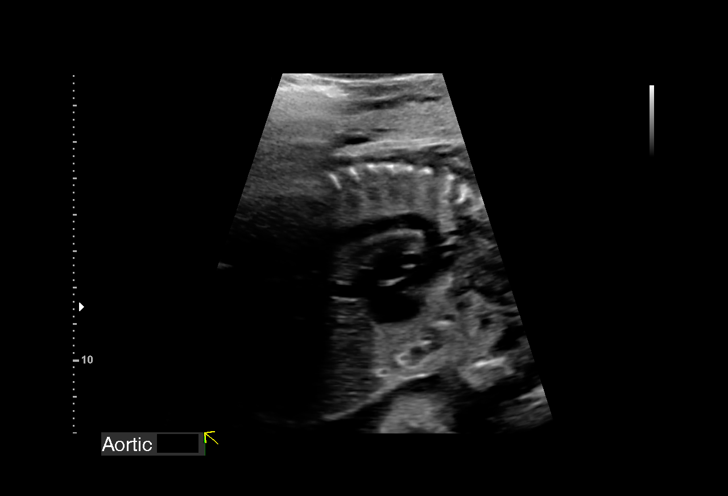
[im 53/58]
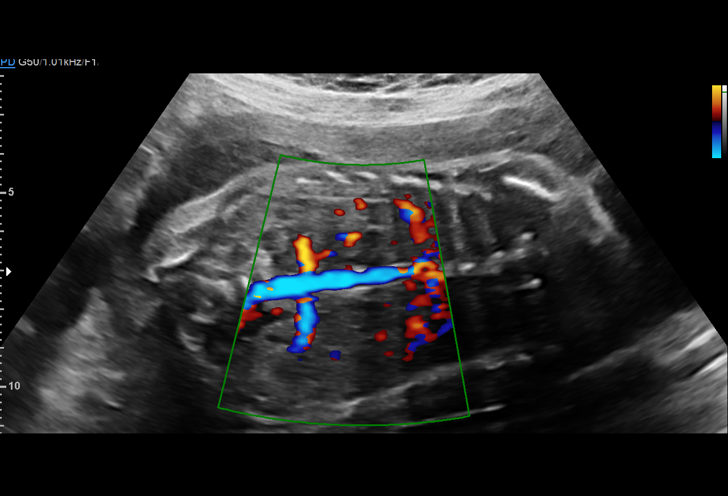
[im 58/58]
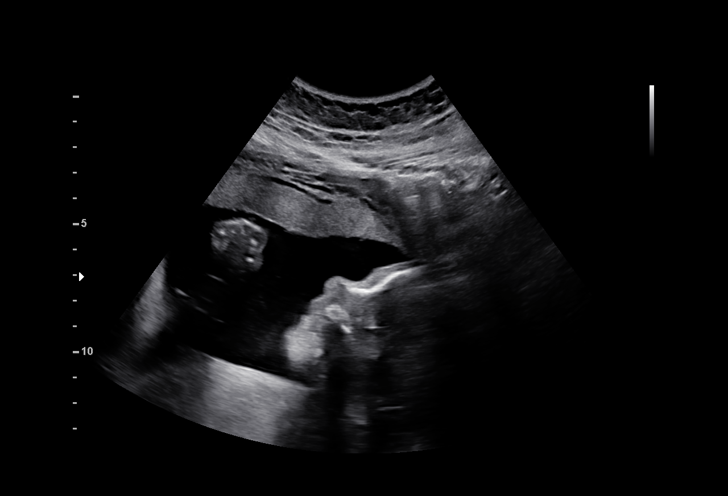

[14 of 28 positions shown; findings below may reference images not displayed]

----------------------------------------------------------------------

 ----------------------------------------------------------------------
Indications

  Antenatal follow-up for nonvisualized fetal
  anatomy
  Obesity complicating pregnancy, second
  trimester
  26 weeks gestation of pregnancy
 ----------------------------------------------------------------------
Vital Signs

                                                Height:        5'2"
Fetal Evaluation

 Num Of Fetuses:          1
 Fetal Heart Rate(bpm):   141
 Cardiac Activity:        Observed
 Presentation:            Cephalic
 Placenta:                Anterior
 P. Cord Insertion:       Previously Visualized

 Amniotic Fluid
 AFI FV:      Within normal limits

                             Largest Pocket(cm)

Biometry

 BPD:      63.8  mm     G. Age:  25w 6d         20  %    CI:        70.13   %    70 - 86
                                                         FL/HC:       19.8  %    18.6 -
 HC:       243   mm     G. Age:  26w 3d         23  %    HC/AC:       1.03       1.04 -
 AC:      235.9  mm     G. Age:  27w 6d         83  %    FL/BPD:      75.4  %    71 - 87
 FL:       48.1  mm     G. Age:  26w 1d         27  %    FL/AC:       20.4  %    20 - 24
 HUM:      42.6  mm     G. Age:  25w 4d         25  %
 CER:      30.8  mm     G. Age:  26w 6d         62  %

 CM:        8.3  mm

 Est. FW:    1412   gm     2 lb 4 oz     65  %
OB History

 Gravidity:    1
Gestational Age

 LMP:           26w 3d        Date:  02/14/18                 EDD:   11/21/18
 U/S Today:     26w 4d                                        EDD:   11/20/18
 Best:          26w 3d     Det. By:  LMP  (02/14/18)          EDD:   11/21/18
Anatomy

 Cranium:               Appears normal         Aortic Arch:            Appears normal
 Cavum:                 Appears normal         Ductal Arch:            Previously seen
 Ventricles:            Appears normal         Diaphragm:              Previously seen
 Choroid Plexus:        Appears normal         Stomach:                Appears normal, left
                                                                       sided
 Cerebellum:            Appears normal         Abdomen:                Appears normal
 Posterior Fossa:       Appears normal         Abdominal Wall:         Previously seen
 Nuchal Fold:           Previously seen        Cord Vessels:           Appears normal (3
                                                                       vessel cord)
 Face:                  Appears normal         Kidneys:                Appear normal
                        (orbits and profile)
 Lips:                  Appears normal         Bladder:                Appears normal
 Thoracic:              Appears normal         Spine:                  Previously seen
 Heart:                 Appears normal         Upper Extremities:      Previously seen
                        (4CH, axis, and situs
 RVOT:                  Appears normal         Lower Extremities:      Previously seen
 LVOT:                  Appears normal

 Other:  Right Heel and Right 5th digit previously visualized.
Cervix Uterus Adnexa

 Cervix
 Not visualized (advanced GA >75wks)

 Uterus
 No abnormality visualized.

 Left Ovary
 Not visualized.

 Right Ovary
 Not visualized.

 Cul De Sac
 No free fluid seen.

 Adnexa
 No abnormality visualized.
Impression

 Normal interval growth and follow up anatomy.
Recommendations

 Follow upin 10 weeks to assess growth given diagnosis of
 obesity.

## 2023-02-18 ENCOUNTER — Emergency Department (HOSPITAL_COMMUNITY)
Admission: EM | Admit: 2023-02-18 | Discharge: 2023-02-18 | Disposition: A | Discharge status: Home / Self Care | Payer: Medicaid Other | Attending: Emergency Medicine | Admitting: Emergency Medicine

## 2023-02-18 ENCOUNTER — Encounter (HOSPITAL_COMMUNITY): Payer: Self-pay

## 2023-02-18 ENCOUNTER — Other Ambulatory Visit: Payer: Self-pay

## 2023-02-18 ENCOUNTER — Emergency Department (HOSPITAL_COMMUNITY): Payer: Medicaid Other

## 2023-02-18 DIAGNOSIS — N200 Calculus of kidney: Secondary | ICD-10-CM | POA: Insufficient documentation

## 2023-02-18 DIAGNOSIS — R0602 Shortness of breath: Secondary | ICD-10-CM | POA: Insufficient documentation

## 2023-02-18 DIAGNOSIS — R109 Unspecified abdominal pain: Secondary | ICD-10-CM | POA: Diagnosis present

## 2023-02-18 LAB — URINALYSIS, ROUTINE W REFLEX MICROSCOPIC
Bacteria, UA: NONE SEEN
Bilirubin Urine: NEGATIVE
Glucose, UA: NEGATIVE mg/dL
Hgb urine dipstick: NEGATIVE
Ketones, ur: NEGATIVE mg/dL
Nitrite: NEGATIVE
Protein, ur: NEGATIVE mg/dL
Specific Gravity, Urine: 1.019 (ref 1.005–1.030)
pH: 5 (ref 5.0–8.0)

## 2023-02-18 LAB — I-STAT BETA HCG BLOOD, ED (MC, WL, AP ONLY): I-stat hCG, quantitative: <5 m[IU]/mL (ref <5)

## 2023-02-18 LAB — CBC WITH DIFFERENTIAL/PLATELET
Abs Immature Granulocytes: 0.01 10*3/uL (ref 0.00–0.07)
Basophils Absolute: 0.1 10*3/uL (ref 0.0–0.1)
Basophils Relative: 1 %
Eosinophils Absolute: 0.1 10*3/uL (ref 0.0–0.5)
Eosinophils Relative: 1 %
HCT: 41.1 % (ref 36.0–46.0)
Hemoglobin: 12.5 g/dL (ref 12.0–15.0)
Immature Granulocytes: 0 %
Lymphocytes Relative: 26 %
Lymphs Abs: 2 10*3/uL (ref 0.7–4.0)
MCH: 22.9 pg — ABNORMAL LOW (ref 26.0–34.0)
MCHC: 30.4 g/dL (ref 30.0–36.0)
MCV: 75.3 fL — ABNORMAL LOW (ref 80.0–100.0)
Monocytes Absolute: 0.3 10*3/uL (ref 0.1–1.0)
Monocytes Relative: 4 %
Neutro Abs: 5.4 10*3/uL (ref 1.7–7.7)
Neutrophils Relative %: 68 %
Platelets: 475 10*3/uL — ABNORMAL HIGH (ref 150–400)
RBC: 5.46 MIL/uL — ABNORMAL HIGH (ref 3.87–5.11)
RDW: 15 % (ref 11.5–15.5)
WBC: 7.9 10*3/uL (ref 4.0–10.5)
nRBC: 0 % (ref 0.0–0.2)

## 2023-02-18 LAB — I-STAT CHEM 8, ED
BUN: 11 mg/dL (ref 6–20)
Calcium, Ion: 1.28 mmol/L (ref 1.15–1.40)
Chloride: 101 mmol/L (ref 98–111)
Creatinine, Ser: 0.6 mg/dL (ref 0.44–1.00)
Glucose, Bld: 72 mg/dL (ref 70–99)
HCT: 43 % (ref 36.0–46.0)
Hemoglobin: 14.6 g/dL (ref 12.0–15.0)
Potassium: 3.8 mmol/L (ref 3.5–5.1)
Sodium: 138 mmol/L (ref 135–145)
TCO2: 27 mmol/L (ref 22–32)

## 2023-02-18 LAB — COMPREHENSIVE METABOLIC PANEL
ALT: 12 U/L (ref 0–44)
AST: 18 U/L (ref 15–41)
Albumin: 4.1 g/dL (ref 3.5–5.0)
Alkaline Phosphatase: 63 U/L (ref 38–126)
Anion gap: 8 (ref 5–15)
BUN: 13 mg/dL (ref 6–20)
CO2: 23 mmol/L (ref 22–32)
Calcium: 9.1 mg/dL (ref 8.9–10.3)
Chloride: 103 mmol/L (ref 98–111)
Creatinine, Ser: 0.65 mg/dL (ref 0.44–1.00)
GFR, Estimated: >60 mL/min (ref >60)
Glucose, Bld: 84 mg/dL (ref 70–99)
Potassium: 4.4 mmol/L (ref 3.5–5.1)
Sodium: 134 mmol/L — ABNORMAL LOW (ref 135–145)
Total Bilirubin: 1.2 mg/dL (ref 0.3–1.2)
Total Protein: 8.3 g/dL — ABNORMAL HIGH (ref 6.5–8.1)

## 2023-02-18 LAB — LIPASE, BLOOD: Lipase: 36 U/L (ref 11–51)

## 2023-02-18 MED ORDER — SODIUM CHLORIDE 0.9 % IV BOLUS
1000.0000 mL | Freq: Once | INTRAVENOUS | Status: AC
  Administered 2023-02-18: 1000 mL via INTRAVENOUS

## 2023-02-18 MED ORDER — KETOROLAC TROMETHAMINE 30 MG/ML IJ SOLN
30.0000 mg | Freq: Once | INTRAMUSCULAR | Status: AC
  Administered 2023-02-18: 30 mg via INTRAVENOUS
  Filled 2023-02-18: qty 1

## 2023-02-18 MED ORDER — IOHEXOL 350 MG/ML SOLN
100.0000 mL | Freq: Once | INTRAVENOUS | Status: AC | PRN
  Administered 2023-02-18: 100 mL via INTRAVENOUS

## 2023-02-18 NOTE — ED Triage Notes (Signed)
C/o pain under right ribs after surgery on 5/16.  Pt reports now pain is radiating into right abd, sob, and nausea Ambulatory to triage w/o distress. Talking in full sentences

## 2023-02-18 NOTE — ED Provider Notes (Signed)
[place] EMERGENCY DEPARTMENT AT [facility] Provider Note   CSN: [id number] Arrival date & time: 02/18/23  1227     History  Chief Complaint  Patient presents with   Abdominal Pain   HPI Jessica Ayala is a 27 y.o. female presenting for abdominal pain.  Started about 2 weeks ago just after her right labral tear repair surgery on May 16.  States the pain is gotten progressively worse now radiating into the chest.  Pain is nonexertional.  Endorses associated nausea but no diarrhea or vomiting.  Denies urinary changes and fever.  Denies trauma to the chest and abdomen.  Patient is on birth control.   Abdominal Pain      Home Medications Prior to Admission medications   Medication Sig Start Date End Date Taking? Authorizing Provider  acetaminophen (TYLENOL) 325 MG tablet Take 2 tablets (650 mg total) by mouth every 4 (four) hours as needed (for pain scale < 4). 11/28/18   Neta Mends, CNM  benzocaine-Menthol (DERMOPLAST) 20-0.5 % AERO Apply 1 application topically as needed for irritation (perineal discomfort). 11/28/18   Neta Mends, CNM  coconut oil OIL Apply 1 application topically as needed. 11/28/18   Neta Mends, CNM  cyclobenzaprine (FLEXERIL) 10 MG tablet Take 1 tablet (10 mg total) by mouth every 8 (eight) hours as needed for muscle spasms. 07/02/18   Raelyn Mora, CNM  ibuprofen (ADVIL,MOTRIN) 600 MG tablet Take 1 tablet (600 mg total) by mouth every 6 (six) hours. 11/28/18   Neta Mends, CNM  iron polysaccharides (NIFEREX) 150 MG capsule Take 1 capsule (150 mg total) by mouth daily. 11/28/18   Neta Mends, CNM  magnesium oxide (MAG-OX) 400 (241.3 Mg) MG tablet Take 1 tablet (400 mg total) by mouth daily. 11/28/18   Neta Mends, CNM  ondansetron (ZOFRAN-ODT) 8 MG disintegrating tablet Take 8 mg by mouth every 8 (eight) hours as needed for nausea or vomiting (PRN).    [provider]      Allergies    Patient has no known allergies.     Review of Systems   Review of Systems  Gastrointestinal:  Positive for abdominal pain.    Physical Exam   Vitals:   02/18/23 1653 02/18/23 1730  BP: 117/72   Pulse: 86 98  Resp: 16   Temp:  98.3 F (36.8 C)  SpO2: 100% 99%    CONSTITUTIONAL:  well-appearing, NAD NEURO:  Alert and oriented x 3, CN 3-12 grossly intact EYES:  eyes equal and reactive ENT/NECK:  Supple, no stridor  CARDIO:  regular rhythm, appears well-perfused  PULM:  No respiratory distress, CTAB GI/GU:  non-distended, ruq tender MSK/SPINE:  No gross deformities, no edema, moves all extremities  SKIN:  no rash, atraumatic  *Additional and/or pertinent findings included in MDM below   ED Results / Procedures / Treatments   Labs (all labs ordered are listed, but only abnormal results are displayed) Labs Reviewed  COMPREHENSIVE METABOLIC PANEL - Abnormal; Notable for the following components:      Result Value   Sodium 134 (*)    Total Protein 8.3 (*)    All other components within normal limits  URINALYSIS, ROUTINE W REFLEX MICROSCOPIC - Abnormal; Notable for the following components:   APPearance HAZY (*)    Leukocytes,Ua MODERATE (*)    All other components within normal limits  CBC WITH DIFFERENTIAL/PLATELET - Abnormal; Notable for the following components:   RBC 5.46 (*)  MCV 75.3 (*)    MCH 22.9 (*)    Platelets 475 (*)    All other components within normal limits  LIPASE, BLOOD  CBC WITH DIFFERENTIAL/PLATELET  I-STAT BETA HCG BLOOD, ED ([facility], [facility], [facility] ONLY)  I-STAT CHEM 8, ED    EKG None  Radiology CT Angio Chest PE W/Cm &/Or Wo Cm  Result Date: 02/18/2023 CLINICAL DATA:  Pulmonary embolism (PE) suspected, high prob; Abdominal pain, acute, nonlocalized EXAM: CT ANGIOGRAPHY CHEST CT ABDOMEN AND PELVIS WITH CONTRAST TECHNIQUE: Multidetector CT imaging of the chest was performed using the standard protocol during bolus administration of intravenous contrast. Multiplanar CT image  reconstructions and MIPs were obtained to evaluate the vascular anatomy. Multidetector CT imaging of the abdomen and pelvis was performed using the standard protocol during bolus administration of intravenous contrast. RADIATION DOSE REDUCTION: This exam was performed according to the departmental dose-optimization program which includes automated exposure control, adjustment of the mA and/or kV according to patient size and/or use of iterative reconstruction technique. CONTRAST:  OMNIPAQUE IOHEXOL 350 MG/ML SOLN COMPARISON:  None Available. FINDINGS: CTA CHEST FINDINGS Cardiovascular: Satisfactory opacification of the pulmonary arteries to the segmental level. No segmental or larger pulmonary embolus. Normal heart size. No pericardial effusion. Mediastinum/Nodes: No enlarged mediastinal, hilar, or axillary lymph nodes. Thyroid gland, trachea, and esophagus demonstrate no significant findings. Lungs/Pleura: Lungs are clear without focal consolidation, mass or suspicious pulmonary nodule. No pleural effusion or pneumothorax. Musculoskeletal: No acute chest wall abnormality. No acute or significant osseous findings. Review of the MIP images confirms the above findings. CT ABDOMEN and PELVIS FINDINGS Hepatobiliary: No focal liver abnormality is seen. No gallstones, gallbladder wall thickening, or biliary dilatation. Pancreas: No pancreatic ductal dilatation or surrounding inflammatory changes. Spleen: Normal in size without focal abnormality. Small inferior pole accessory spleen Adrenals/Urinary Tract: Adrenal glands are unremarkable. Punctate RIGHT superior renal collecting system nonobstructing nephrolith. Kidneys are otherwise normal, without focal lesion, or hydronephrosis. Bladder is unremarkable. Stomach/Bowel: Stomach is within normal limits. Appendix appears normal. No evidence of bowel wall thickening, distention, or inflammatory changes. Vascular/Lymphatic: No significant vascular findings are present.  No enlarged abdominal or pelvic lymph nodes. Reproductive: IUD. Dominant RIGHT ovarian follicles. Uterus and LEFT adnexa are unremarkable. Other: No abdominal wall hernia or abnormality. No abdominopelvic ascites. Musculoskeletal: No acute or significant osseous findings. Review of the MIP images confirms the above findings. IMPRESSION: 1. No segmental or larger pulmonary embolus. 2. No acute vascular or nonvascular abnormality within the chest, abdomen or pelvis. 3. RIGHT punctate nonobstructing nephrolithiasis. Electronically Signed   By: Roanna Banning M.D.   On: 02/18/2023 16:56   CT ABDOMEN PELVIS W CONTRAST  Result Date: 02/18/2023 CLINICAL DATA:  Pulmonary embolism (PE) suspected, high prob; Abdominal pain, acute, nonlocalized EXAM: CT ANGIOGRAPHY CHEST CT ABDOMEN AND PELVIS WITH CONTRAST TECHNIQUE: Multidetector CT imaging of the chest was performed using the standard protocol during bolus administration of intravenous contrast. Multiplanar CT image reconstructions and MIPs were obtained to evaluate the vascular anatomy. Multidetector CT imaging of the abdomen and pelvis was performed using the standard protocol during bolus administration of intravenous contrast. RADIATION DOSE REDUCTION: This exam was performed according to the departmental dose-optimization program which includes automated exposure control, adjustment of the mA and/or kV according to patient size and/or use of iterative reconstruction technique. CONTRAST:  OMNIPAQUE IOHEXOL 350 MG/ML SOLN COMPARISON:  None Available. FINDINGS: CTA CHEST FINDINGS Cardiovascular: Satisfactory opacification of the pulmonary arteries to the segmental level. No segmental or larger  pulmonary embolus. Normal heart size. No pericardial effusion. Mediastinum/Nodes: No enlarged mediastinal, hilar, or axillary lymph nodes. Thyroid gland, trachea, and esophagus demonstrate no significant findings. Lungs/Pleura: Lungs are clear without focal consolidation,  mass or suspicious pulmonary nodule. No pleural effusion or pneumothorax. Musculoskeletal: No acute chest wall abnormality. No acute or significant osseous findings. Review of the MIP images confirms the above findings. CT ABDOMEN and PELVIS FINDINGS Hepatobiliary: No focal liver abnormality is seen. No gallstones, gallbladder wall thickening, or biliary dilatation. Pancreas: No pancreatic ductal dilatation or surrounding inflammatory changes. Spleen: Normal in size without focal abnormality. Small inferior pole accessory spleen Adrenals/Urinary Tract: Adrenal glands are unremarkable. Punctate RIGHT superior renal collecting system nonobstructing nephrolith. Kidneys are otherwise normal, without focal lesion, or hydronephrosis. Bladder is unremarkable. Stomach/Bowel: Stomach is within normal limits. Appendix appears normal. No evidence of bowel wall thickening, distention, or inflammatory changes. Vascular/Lymphatic: No significant vascular findings are present. No enlarged abdominal or pelvic lymph nodes. Reproductive: IUD. Dominant RIGHT ovarian follicles. Uterus and LEFT adnexa are unremarkable. Other: No abdominal wall hernia or abnormality. No abdominopelvic ascites. Musculoskeletal: No acute or significant osseous findings. Review of the MIP images confirms the above findings. IMPRESSION: 1. No segmental or larger pulmonary embolus. 2. No acute vascular or nonvascular abnormality within the chest, abdomen or pelvis. 3. RIGHT punctate nonobstructing nephrolithiasis. Electronically Signed   By: Roanna Banning M.D.   On: 02/18/2023 16:56    Procedures Procedures    Medications Ordered in ED Medications  iohexol (OMNIPAQUE) 350 MG/ML injection 100 mL (100 mLs Intravenous Contrast Given 02/18/23 1624)  sodium chloride 0.9 % bolus 1,000 mL (1,000 mLs Intravenous New Bag/Given 02/18/23 1746)  ketorolac (TORADOL) 30 MG/ML injection 30 mg (30 mg Intravenous Given 02/18/23 1745)    ED Course/ Medical Decision  Making/ A&P Clinical Course as of 02/18/23 1932  Wed Feb 18, 2023  1607 I-stat hCG, quantitative: <5.0 [JR]    Clinical Course User Index [JR] Gareth Eagle, PA-C                             Medical Decision Making Amount and/or Complexity of Data Reviewed Labs: ordered. Decision-making details documented in ED Course. Radiology: ordered.  Risk Prescription drug management.   Initial Impression and Ddx 26 year old well-appearing female presenting for right upper quadrant pain.  Exam notable for right upper quadrant tenderness.  DDx includes nephrolithiasis, acute cholecystitis, pyelonephritis, PE, and pancreatitis. Patient PMH that increases complexity of ED encounter:  recent surgery  Interpretation of Diagnostics -I independent reviewed and interpreted the labs as followed: pyuria  - I independently visualized the following imaging with scope of interpretation limited to determining acute life threatening conditions related to emergency care: CT ab pelvis, which revealed right punctate nonobstructive nephrolithiasis.  CTA of the chest was negative for PE  Patient Reassessment and Ultimate Disposition/Management Given recent history of surgery and right upper quadrant pain radiating to the chest shortness of breath, on birth control, there was concern for PE.  Fortunately CTA was negative. CT of the abdomen pelvis did reveal a right-sided nonobstructive punctate kidney stone.  This is likely the etiology of her pain. Treated with volume resuscitation and Toradol.  Patient stated her symptoms improved.  Advised treatment at home with assertive hydration and pain control with Tylenol and ibuprofen.  Advised to follow-up with urology.  Vital stable at discharge.  Discharged home  Patient management required discussion with the following services or  consulting groups:  None  Complexity of Problems Addressed Acute complicated illness or Injury  Additional Data Reviewed and  Analyzed Further history obtained from: Past medical history and medications listed in the EMR and Prior ED visit notes  Patient Encounter Risk Assessment None         Final Clinical Impression(s) / ED Diagnoses Final diagnoses:  Nephrolithiasis    Rx / DC Orders ED Discharge Orders     None         Gareth Eagle, PA-C 02/18/23 Angela Adam, MD 02/21/23 [phone]

## 2023-02-18 NOTE — ED Notes (Signed)
Patient transported to CT 

## 2023-02-18 NOTE — Discharge Instructions (Addendum)
Evaluation for your abdominal pain really did have a small kidney stone in the right kidney.  It is nonobstructive and your labs and urinalysis did not indicate infection at this time.  However recommend that you do follow-up with urology in the next week for reevaluation.  In the meantime recommend assertive hydration with water and Gatorade.  If you start to have a fever, worsening abdominal pain, painful urination, blood in the urine or malodorous urine or any other concern please return emerged part for further evaluation.
